# Patient Record
Sex: Female | Born: 1955
Health system: Southern US, Community
[De-identification: ages and names within clinical notes are randomized; demographics above are authoritative.]

## PROBLEM LIST (undated history)

## (undated) DIAGNOSIS — D759 Disease of blood and blood-forming organs, unspecified: Secondary | ICD-10-CM

## (undated) DIAGNOSIS — F419 Anxiety disorder, unspecified: Secondary | ICD-10-CM

## (undated) DIAGNOSIS — IMO0002 Reserved for concepts with insufficient information to code with codable children: Secondary | ICD-10-CM

## (undated) DIAGNOSIS — I10 Essential (primary) hypertension: Secondary | ICD-10-CM

## (undated) DIAGNOSIS — K759 Inflammatory liver disease, unspecified: Secondary | ICD-10-CM

## (undated) DIAGNOSIS — Z8489 Family history of other specified conditions: Secondary | ICD-10-CM

---

## 1998-11-24 ENCOUNTER — Encounter (INDEPENDENT_AMBULATORY_CARE_PROVIDER_SITE_OTHER): Payer: Self-pay

## 1998-11-24 ENCOUNTER — Other Ambulatory Visit: Admission: RE | Admit: 1998-11-24 | Discharge: 1998-11-24 | Payer: Self-pay | Admitting: Gynecology

## 2005-03-05 ENCOUNTER — Ambulatory Visit: Payer: Self-pay | Admitting: Gastroenterology

## 2005-04-18 ENCOUNTER — Ambulatory Visit: Payer: Self-pay | Admitting: Gastroenterology

## 2005-05-20 ENCOUNTER — Ambulatory Visit (HOSPITAL_COMMUNITY): Admission: RE | Admit: 2005-05-20 | Discharge: 2005-05-20 | Payer: Self-pay | Admitting: Gastroenterology

## 2005-05-20 ENCOUNTER — Encounter (INDEPENDENT_AMBULATORY_CARE_PROVIDER_SITE_OTHER): Payer: Self-pay | Admitting: Specialist

## 2005-11-29 ENCOUNTER — Ambulatory Visit: Payer: Self-pay | Admitting: Gastroenterology

## 2006-07-08 ENCOUNTER — Ambulatory Visit: Payer: Self-pay | Admitting: Gastroenterology

## 2008-08-18 ENCOUNTER — Ambulatory Visit: Payer: Self-pay | Admitting: Gastroenterology

## 2012-10-23 ENCOUNTER — Encounter: Payer: Self-pay | Admitting: Internal Medicine

## 2016-05-06 ENCOUNTER — Inpatient Hospital Stay (HOSPITAL_COMMUNITY): Payer: Federal, State, Local not specified - PPO | Admitting: Certified Registered Nurse Anesthetist

## 2016-05-06 ENCOUNTER — Inpatient Hospital Stay (HOSPITAL_COMMUNITY)
Admission: EM | Admit: 2016-05-06 | Discharge: 2016-05-12 | DRG: 331 | Disposition: A | Discharge status: Home / Self Care | Payer: Federal, State, Local not specified - PPO | Attending: Emergency Medicine | Admitting: Emergency Medicine

## 2016-05-06 ENCOUNTER — Encounter (HOSPITAL_COMMUNITY): Admission: EM | Disposition: A | Discharge status: Home / Self Care | Payer: Self-pay | Source: Home / Self Care

## 2016-05-06 ENCOUNTER — Encounter (HOSPITAL_COMMUNITY): Payer: Self-pay | Admitting: Neurology

## 2016-05-06 ENCOUNTER — Emergency Department (HOSPITAL_COMMUNITY): Payer: Federal, State, Local not specified - PPO

## 2016-05-06 DIAGNOSIS — R1013 Epigastric pain: Secondary | ICD-10-CM

## 2016-05-06 DIAGNOSIS — R198 Other specified symptoms and signs involving the digestive system and abdomen: Secondary | ICD-10-CM | POA: Diagnosis present

## 2016-05-06 DIAGNOSIS — F17200 Nicotine dependence, unspecified, uncomplicated: Secondary | ICD-10-CM | POA: Diagnosis present

## 2016-05-06 DIAGNOSIS — K668 Other specified disorders of peritoneum: Secondary | ICD-10-CM | POA: Diagnosis present

## 2016-05-06 DIAGNOSIS — Z5331 Laparoscopic surgical procedure converted to open procedure: Secondary | ICD-10-CM

## 2016-05-06 DIAGNOSIS — R160 Hepatomegaly, not elsewhere classified: Secondary | ICD-10-CM

## 2016-05-06 DIAGNOSIS — IMO0002 Reserved for concepts with insufficient information to code with codable children: Secondary | ICD-10-CM

## 2016-05-06 DIAGNOSIS — K265 Chronic or unspecified duodenal ulcer with perforation: Principal | ICD-10-CM | POA: Diagnosis present

## 2016-05-06 HISTORY — PX: LAPAROTOMY: SHX154

## 2016-05-06 HISTORY — DX: Inflammatory liver disease, unspecified: K75.9

## 2016-05-06 HISTORY — DX: Essential (primary) hypertension: I10

## 2016-05-06 HISTORY — PX: REPAIR OF PERFORATED ULCER: SHX6065

## 2016-05-06 HISTORY — DX: Anxiety disorder, unspecified: F41.9

## 2016-05-06 HISTORY — DX: Family history of other specified conditions: Z84.89

## 2016-05-06 HISTORY — DX: Reserved for concepts with insufficient information to code with codable children: IMO0002

## 2016-05-06 HISTORY — PX: LAPAROSCOPY: SHX197

## 2016-05-06 LAB — URINALYSIS, ROUTINE W REFLEX MICROSCOPIC
BILIRUBIN URINE: NEGATIVE
Glucose, UA: 150 mg/dL — AB
HGB URINE DIPSTICK: NEGATIVE
KETONES UR: NEGATIVE mg/dL
Leukocytes, UA: NEGATIVE
NITRITE: NEGATIVE
Protein, ur: NEGATIVE mg/dL
Specific Gravity, Urine: >1.046 — ABNORMAL HIGH (ref 1.005–1.030)
pH: 6 (ref 5.0–8.0)

## 2016-05-06 LAB — CBC
HCT: 40.6 % (ref 36.0–46.0)
Hemoglobin: 13.5 g/dL (ref 12.0–15.0)
MCH: 29.5 pg (ref 26.0–34.0)
MCHC: 33.3 g/dL (ref 30.0–36.0)
MCV: 88.8 fL (ref 78.0–100.0)
Platelets: 164 10*3/uL (ref 150–400)
RBC: 4.57 MIL/uL (ref 3.87–5.11)
RDW: 12.4 % (ref 11.5–15.5)
WBC: 11.7 10*3/uL — ABNORMAL HIGH (ref 4.0–10.5)

## 2016-05-06 LAB — COMPREHENSIVE METABOLIC PANEL
ALBUMIN: 4.2 g/dL (ref 3.5–5.0)
ALK PHOS: 96 U/L (ref 38–126)
ALT: 17 U/L (ref 14–54)
AST: 18 U/L (ref 15–41)
Anion gap: 9 (ref 5–15)
BILIRUBIN TOTAL: 0.5 mg/dL (ref 0.3–1.2)
BUN: 11 mg/dL (ref 6–20)
CALCIUM: 9.5 mg/dL (ref 8.9–10.3)
CO2: 26 mmol/L (ref 22–32)
CREATININE: 0.85 mg/dL (ref 0.44–1.00)
Chloride: 106 mmol/L (ref 101–111)
GFR calc Af Amer: >60 mL/min (ref >60)
GFR calc non Af Amer: >60 mL/min (ref >60)
GLUCOSE: 205 mg/dL — AB (ref 65–99)
Potassium: 3.2 mmol/L — ABNORMAL LOW (ref 3.5–5.1)
Sodium: 141 mmol/L (ref 135–145)
TOTAL PROTEIN: 7.3 g/dL (ref 6.5–8.1)

## 2016-05-06 LAB — TYPE AND SCREEN
ABO/RH(D): O POS
Antibody Screen: NEGATIVE

## 2016-05-06 LAB — LIPASE, BLOOD: Lipase: 25 U/L (ref 11–51)

## 2016-05-06 LAB — APTT: APTT: 33 s (ref 24–36)

## 2016-05-06 LAB — PROTIME-INR
INR: 1.01
PROTHROMBIN TIME: 13.3 s (ref 11.4–15.2)

## 2016-05-06 LAB — ABO/RH: ABO/RH(D): O POS

## 2016-05-06 SURGERY — LAPAROSCOPY, DIAGNOSTIC
Anesthesia: General | Site: Abdomen

## 2016-05-06 MED ORDER — SUCCINYLCHOLINE CHLORIDE 200 MG/10ML IV SOSY
PREFILLED_SYRINGE | INTRAVENOUS | Status: AC
  Filled 2016-05-06: qty 30

## 2016-05-06 MED ORDER — IOPAMIDOL (ISOVUE-300) INJECTION 61%
INTRAVENOUS | Status: AC
  Administered 2016-05-06: 100 mL
  Filled 2016-05-06: qty 100

## 2016-05-06 MED ORDER — PANTOPRAZOLE SODIUM 40 MG IV SOLR
40.0000 mg | Freq: Two times a day (BID) | INTRAVENOUS | Status: DC
  Administered 2016-05-06 – 2016-05-11 (×10): 40 mg via INTRAVENOUS
  Filled 2016-05-06 (×10): qty 40

## 2016-05-06 MED ORDER — MORPHINE SULFATE (PF) 2 MG/ML IV SOLN
1.0000 mg | INTRAVENOUS | Status: DC | PRN
  Administered 2016-05-06: 2 mg via INTRAVENOUS
  Filled 2016-05-06: qty 1

## 2016-05-06 MED ORDER — EVICEL 5 ML EX KIT
PACK | CUTANEOUS | Status: DC | PRN
  Administered 2016-05-06: 5 mL

## 2016-05-06 MED ORDER — PROMETHAZINE HCL 25 MG/ML IJ SOLN
12.5000 mg | Freq: Four times a day (QID) | INTRAMUSCULAR | Status: DC | PRN
  Administered 2016-05-06: 12.5 mg via INTRAVENOUS
  Filled 2016-05-06: qty 1

## 2016-05-06 MED ORDER — MORPHINE SULFATE (PF) 4 MG/ML IV SOLN
4.0000 mg | Freq: Once | INTRAVENOUS | Status: AC
  Administered 2016-05-06: 4 mg via INTRAVENOUS
  Filled 2016-05-06: qty 1

## 2016-05-06 MED ORDER — WHITE PETROLATUM GEL: Status: DC | PRN

## 2016-05-06 MED ORDER — WHITE PETROLATUM GEL
Status: AC
  Administered 2016-05-06: 16:00:00
  Filled 2016-05-06: qty 1

## 2016-05-06 MED ORDER — SUGAMMADEX SODIUM 200 MG/2ML IV SOLN
INTRAVENOUS | Status: DC | PRN
  Administered 2016-05-06: 200 mg via INTRAVENOUS

## 2016-05-06 MED ORDER — BUPIVACAINE HCL (PF) 0.25 % IJ SOLN
INTRAMUSCULAR | Status: AC
  Filled 2016-05-06: qty 30

## 2016-05-06 MED ORDER — SODIUM CHLORIDE 0.9 % IV SOLN: INTRAVENOUS | Status: DC

## 2016-05-06 MED ORDER — ROCURONIUM BROMIDE 10 MG/ML (PF) SYRINGE
PREFILLED_SYRINGE | INTRAVENOUS | Status: DC | PRN
  Administered 2016-05-06: 50 mg via INTRAVENOUS
  Administered 2016-05-06: 20 mg via INTRAVENOUS

## 2016-05-06 MED ORDER — STUDY - INVESTIGATIONAL DRUG SIMPLE RECORD
Status: AC
Start: 2016-05-06 — End: 2016-05-07
  Filled 2016-05-06: qty 0

## 2016-05-06 MED ORDER — PHENYLEPHRINE HCL 10 MG/ML IJ SOLN
INTRAMUSCULAR | Status: DC | PRN
  Administered 2016-05-06 (×2): 120 ug via INTRAVENOUS
  Administered 2016-05-06: 160 ug via INTRAVENOUS

## 2016-05-06 MED ORDER — HYDROMORPHONE HCL 2 MG/ML IJ SOLN
1.0000 mg | Freq: Once | INTRAMUSCULAR | Status: AC
  Administered 2016-05-06: 1 mg via INTRAVENOUS
  Filled 2016-05-06: qty 1

## 2016-05-06 MED ORDER — POTASSIUM CHLORIDE IN NACL 40-0.9 MEQ/L-% IV SOLN
INTRAVENOUS | Status: DC
  Administered 2016-05-06 – 2016-05-09 (×9): 125 mL/h via INTRAVENOUS
  Administered 2016-05-10 – 2016-05-11 (×3): 75 mL/h via INTRAVENOUS
  Filled 2016-05-06 (×14): qty 1000

## 2016-05-06 MED ORDER — ONDANSETRON HCL 4 MG/2ML IJ SOLN
INTRAMUSCULAR | Status: AC
  Filled 2016-05-06: qty 2

## 2016-05-06 MED ORDER — EVICEL 5 ML EX KIT
PACK | CUTANEOUS | Status: AC
  Filled 2016-05-06: qty 1

## 2016-05-06 MED ORDER — NALOXONE HCL 0.4 MG/ML IJ SOLN: 0.4000 mg | INTRAMUSCULAR | Status: DC | PRN

## 2016-05-06 MED ORDER — FENTANYL CITRATE (PF) 100 MCG/2ML IJ SOLN: 25.0000 ug | INTRAMUSCULAR | Status: DC | PRN

## 2016-05-06 MED ORDER — PIPERACILLIN-TAZOBACTAM 3.375 G IVPB 30 MIN
3.3750 g | Freq: Once | INTRAVENOUS | Status: AC
  Administered 2016-05-06: 3.375 g via INTRAVENOUS
  Filled 2016-05-06: qty 50

## 2016-05-06 MED ORDER — HYDROMORPHONE 1 MG/ML IV SOLN
INTRAVENOUS | Status: DC
  Administered 2016-05-06: 25 mg via INTRAVENOUS
  Administered 2016-05-07: 0.3 mg via INTRAVENOUS
  Administered 2016-05-07: 0.6 mg via INTRAVENOUS
  Administered 2016-05-08 (×3): 0.3 mg via INTRAVENOUS
  Administered 2016-05-08 – 2016-05-09 (×2): 0 mg via INTRAVENOUS
  Administered 2016-05-09: 1 mg via INTRAVENOUS
  Administered 2016-05-09: 1 mL via INTRAVENOUS
  Administered 2016-05-09: 0.6 mg via INTRAVENOUS
  Administered 2016-05-09: 1 mg via INTRAVENOUS
  Administered 2016-05-09: 0.3 mg via INTRAVENOUS
  Administered 2016-05-10: 1 mL via INTRAVENOUS
  Administered 2016-05-10: 0 mL via INTRAVENOUS
  Filled 2016-05-06: qty 25

## 2016-05-06 MED ORDER — PHENYLEPHRINE 40 MCG/ML (10ML) SYRINGE FOR IV PUSH (FOR BLOOD PRESSURE SUPPORT)
PREFILLED_SYRINGE | INTRAVENOUS | Status: AC
  Filled 2016-05-06: qty 10

## 2016-05-06 MED ORDER — FENTANYL CITRATE (PF) 100 MCG/2ML IJ SOLN
INTRAMUSCULAR | Status: DC | PRN
  Administered 2016-05-06 (×4): 50 ug via INTRAVENOUS
  Administered 2016-05-06 (×2): 100 ug via INTRAVENOUS

## 2016-05-06 MED ORDER — LACTATED RINGERS IV SOLN
INTRAVENOUS | Status: DC
  Administered 2016-05-06 (×2): via INTRAVENOUS

## 2016-05-06 MED ORDER — PHENYLEPHRINE HCL 10 MG/ML IJ SOLN
INTRAMUSCULAR | Status: DC | PRN
  Administered 2016-05-06: 50 ug/min via INTRAVENOUS

## 2016-05-06 MED ORDER — SUCCINYLCHOLINE CHLORIDE 20 MG/ML IJ SOLN
INTRAMUSCULAR | Status: DC | PRN
  Administered 2016-05-06: 120 mg via INTRAVENOUS

## 2016-05-06 MED ORDER — BUPIVACAINE HCL 0.25 % IJ SOLN
INTRAMUSCULAR | Status: DC | PRN
  Administered 2016-05-06: 3 mL

## 2016-05-06 MED ORDER — PIPERACILLIN-TAZOBACTAM 3.375 G IVPB
3.3750 g | Freq: Three times a day (TID) | INTRAVENOUS | Status: DC
  Administered 2016-05-06 – 2016-05-11 (×14): 3.375 g via INTRAVENOUS
  Filled 2016-05-06 (×15): qty 50

## 2016-05-06 MED ORDER — ONDANSETRON 4 MG PO TBDP: 4.0000 mg | ORAL_TABLET | Freq: Four times a day (QID) | ORAL | Status: DC | PRN

## 2016-05-06 MED ORDER — SODIUM CHLORIDE 0.9 % IV BOLUS (SEPSIS)
1000.0000 mL | Freq: Once | INTRAVENOUS | Status: AC
  Administered 2016-05-06: 1000 mL via INTRAVENOUS

## 2016-05-06 MED ORDER — ONDANSETRON HCL 4 MG/2ML IJ SOLN
4.0000 mg | Freq: Once | INTRAMUSCULAR | Status: AC
  Administered 2016-05-06: 4 mg via INTRAVENOUS
  Filled 2016-05-06: qty 2

## 2016-05-06 MED ORDER — ONDANSETRON HCL 4 MG/2ML IJ SOLN
INTRAMUSCULAR | Status: AC
Start: 2016-05-06 — End: 2016-05-07
  Filled 2016-05-06: qty 2

## 2016-05-06 MED ORDER — ENOXAPARIN SODIUM 40 MG/0.4ML ~~LOC~~ SOLN
40.0000 mg | SUBCUTANEOUS | Status: DC
  Administered 2016-05-07 – 2016-05-12 (×6): 40 mg via SUBCUTANEOUS
  Filled 2016-05-06 (×6): qty 0.4

## 2016-05-06 MED ORDER — PROPOFOL 10 MG/ML IV BOLUS
INTRAVENOUS | Status: AC
  Filled 2016-05-06: qty 20

## 2016-05-06 MED ORDER — SODIUM CHLORIDE 0.9% FLUSH: 9.0000 mL | INTRAVENOUS | Status: DC | PRN

## 2016-05-06 MED ORDER — MORPHINE SULFATE (PF) 4 MG/ML IV SOLN: 1.0000 mg | INTRAVENOUS | Status: DC | PRN

## 2016-05-06 MED ORDER — PROPOFOL 10 MG/ML IV BOLUS
INTRAVENOUS | Status: DC | PRN
  Administered 2016-05-06: 120 mg via INTRAVENOUS
  Administered 2016-05-06: 60 mg via INTRAVENOUS

## 2016-05-06 MED ORDER — DEXTROSE-NACL 5-0.45 % IV SOLN
INTRAVENOUS | Status: DC
  Administered 2016-05-06: 10:00:00 via INTRAVENOUS

## 2016-05-06 MED ORDER — DIPHENHYDRAMINE HCL 50 MG/ML IJ SOLN: 25.0000 mg | Freq: Four times a day (QID) | INTRAMUSCULAR | Status: DC | PRN

## 2016-05-06 MED ORDER — ESMOLOL HCL 100 MG/10ML IV SOLN
INTRAVENOUS | Status: AC
  Filled 2016-05-06: qty 10

## 2016-05-06 MED ORDER — DIPHENHYDRAMINE HCL 25 MG PO CAPS: 25.0000 mg | ORAL_CAPSULE | Freq: Four times a day (QID) | ORAL | Status: DC | PRN

## 2016-05-06 MED ORDER — MIDAZOLAM HCL 2 MG/2ML IJ SOLN
INTRAMUSCULAR | Status: AC
  Filled 2016-05-06: qty 2

## 2016-05-06 MED ORDER — FENTANYL CITRATE (PF) 100 MCG/2ML IJ SOLN
INTRAMUSCULAR | Status: AC
  Filled 2016-05-06: qty 4

## 2016-05-06 MED ORDER — MIDAZOLAM HCL 5 MG/5ML IJ SOLN
INTRAMUSCULAR | Status: DC | PRN
  Administered 2016-05-06: 2 mg via INTRAVENOUS

## 2016-05-06 MED ORDER — ONDANSETRON HCL 4 MG/2ML IJ SOLN
4.0000 mg | Freq: Four times a day (QID) | INTRAMUSCULAR | Status: DC | PRN
  Administered 2016-05-06: 4 mg via INTRAVENOUS
  Filled 2016-05-06: qty 2

## 2016-05-06 MED ORDER — LIDOCAINE 2% (20 MG/ML) 5 ML SYRINGE
INTRAMUSCULAR | Status: DC | PRN
  Administered 2016-05-06: 100 mg via INTRAVENOUS

## 2016-05-06 MED ORDER — ONDANSETRON HCL 4 MG/2ML IJ SOLN
INTRAMUSCULAR | Status: DC | PRN
  Administered 2016-05-06: 4 mg via INTRAVENOUS

## 2016-05-06 MED ORDER — 0.9 % SODIUM CHLORIDE (POUR BTL) OPTIME
TOPICAL | Status: DC | PRN
  Administered 2016-05-06 (×4): 1000 mL

## 2016-05-06 MED ORDER — ROCURONIUM BROMIDE 50 MG/5ML IV SOSY
PREFILLED_SYRINGE | INTRAVENOUS | Status: AC
  Filled 2016-05-06: qty 15

## 2016-05-06 MED ORDER — ALBUMIN HUMAN 5 % IV SOLN
INTRAVENOUS | Status: DC | PRN
  Administered 2016-05-06: 14:00:00 via INTRAVENOUS

## 2016-05-06 MED ORDER — SUGAMMADEX SODIUM 200 MG/2ML IV SOLN
INTRAVENOUS | Status: AC
  Filled 2016-05-06: qty 2

## 2016-05-06 SURGICAL SUPPLY — 50 items
ADH SKN CLS APL DERMABOND .7 (GAUZE/BANDAGES/DRESSINGS)
BLADE SURG 10 STRL SS (BLADE) ×2 IMPLANT
BLADE SURG ROTATE 9660 (MISCELLANEOUS) ×2 IMPLANT
CANISTER SUCTION 2500CC (MISCELLANEOUS) ×3 IMPLANT
CHLORAPREP W/TINT 26ML (MISCELLANEOUS) ×3 IMPLANT
COVER SURGICAL LIGHT HANDLE (MISCELLANEOUS) ×3 IMPLANT
DECANTER SPIKE VIAL GLASS SM (MISCELLANEOUS) ×4 IMPLANT
DERMABOND ADVANCED (GAUZE/BANDAGES/DRESSINGS)
DERMABOND ADVANCED .7 DNX12 (GAUZE/BANDAGES/DRESSINGS) ×1 IMPLANT
DRAIN CHANNEL 19F RND (DRAIN) ×2 IMPLANT
DRAPE LAPAROSCOPIC ABDOMINAL (DRAPES) ×1 IMPLANT
DRAPE WARM FLUID 44X44 (DRAPE) ×3 IMPLANT
ELECT CAUTERY BLADE 6.4 (BLADE) ×2 IMPLANT
ELECT REM PT RETURN 9FT ADLT (ELECTROSURGICAL) ×3
ELECTRODE REM PT RTRN 9FT ADLT (ELECTROSURGICAL) ×1 IMPLANT
EVACUATOR SILICONE 100CC (DRAIN) ×2 IMPLANT
GAUZE SPONGE 4X4 12PLY STRL (GAUZE/BANDAGES/DRESSINGS) ×2 IMPLANT
GLOVE BIO SURGEON STRL SZ7.5 (GLOVE) ×1 IMPLANT
GOWN STRL REUS W/ TWL LRG LVL3 (GOWN DISPOSABLE) ×3 IMPLANT
GOWN STRL REUS W/TWL 2XL LVL3 (GOWN DISPOSABLE) ×3 IMPLANT
GOWN STRL REUS W/TWL LRG LVL3 (GOWN DISPOSABLE) ×9
KIT BASIN OR (CUSTOM PROCEDURE TRAY) ×3 IMPLANT
KIT ROOM TURNOVER OR (KITS) ×3 IMPLANT
NS IRRIG 1000ML POUR BTL (IV SOLUTION) ×9 IMPLANT
PAD ABD 8X10 STRL (GAUZE/BANDAGES/DRESSINGS) ×2 IMPLANT
PAD ARMBOARD 7.5X6 YLW CONV (MISCELLANEOUS) ×6 IMPLANT
PENCIL BUTTON HOLSTER BLD 10FT (ELECTRODE) ×2 IMPLANT
SCALPEL HARMONIC ACE (MISCELLANEOUS) IMPLANT
SCISSORS LAP 5X35 DISP (ENDOMECHANICALS) ×2 IMPLANT
SET IRRIG TUBING LAPAROSCOPIC (IRRIGATION / IRRIGATOR) IMPLANT
SLEEVE ENDOPATH XCEL 5M (ENDOMECHANICALS) ×3 IMPLANT
SUCTION POOLE TIP (SUCTIONS) ×2 IMPLANT
SUT ETHILON 2 0 FS 18 (SUTURE) ×2 IMPLANT
SUT MNCRL AB 4-0 PS2 18 (SUTURE) ×1 IMPLANT
SUT PDS AB 1 TP1 96 (SUTURE) ×4 IMPLANT
SUT SILK 2 0 SH CR/8 (SUTURE) ×2 IMPLANT
SUT SILK 2 0 TIES 10X30 (SUTURE) ×2 IMPLANT
SUT SILK 3 0 SH CR/8 (SUTURE) ×2 IMPLANT
SUT SILK 3 0 TIES 10X30 (SUTURE) ×2 IMPLANT
TOWEL OR 17X24 6PK STRL BLUE (TOWEL DISPOSABLE) ×3 IMPLANT
TOWEL OR 17X26 10 PK STRL BLUE (TOWEL DISPOSABLE) ×1 IMPLANT
TRAY LAPAROSCOPIC MC (CUSTOM PROCEDURE TRAY) ×3 IMPLANT
TROCAR XCEL BLUNT TIP 100MML (ENDOMECHANICALS) ×2 IMPLANT
TROCAR XCEL NON-BLD 11X100MML (ENDOMECHANICALS) IMPLANT
TROCAR XCEL NON-BLD 5MMX100MML (ENDOMECHANICALS) ×3 IMPLANT
TUBE CONNECTING 12'X1/4 (SUCTIONS) ×1
TUBE CONNECTING 12X1/4 (SUCTIONS) ×1 IMPLANT
TUBING INSUFFLATION (TUBING) ×3 IMPLANT
WATER STERILE IRR 1000ML POUR (IV SOLUTION) IMPLANT
YANKAUER SUCT BULB TIP NO VENT (SUCTIONS) ×2 IMPLANT

## 2016-05-06 NOTE — Anesthesia Preprocedure Evaluation (Signed)
Anesthesia Evaluation  Patient identified by MRN, date of birth, ID band Patient awake    Reviewed: Allergy & Precautions, H&P , Patient's Chart, lab work & pertinent test results, reviewed documented beta blocker date and time   Airway Mallampati: II  TM Distance: >3 FB Neck ROM: full    Dental no notable dental hx.    Pulmonary Current Smoker,    Pulmonary exam normal breath sounds clear to auscultation       Cardiovascular hypertension,  Rhythm:regular Rate:Normal     Neuro/Psych    GI/Hepatic   Endo/Other    Renal/GU      Musculoskeletal   Abdominal   Peds  Hematology   Anesthesia Other Findings   Reproductive/Obstetrics                             Anesthesia Physical Anesthesia Plan  ASA: II  Anesthesia Plan: General   Post-op Pain Management:    Induction: Intravenous  Airway Management Planned: Oral ETT  Additional Equipment:   Intra-op Plan:   Post-operative Plan: Extubation in OR  Informed Consent: I have reviewed the patients History and Physical, chart, labs and discussed the procedure including the risks, benefits and alternatives for the proposed anesthesia with the patient or authorized representative who has indicated his/her understanding and acceptance.   Dental Advisory Given and Dental advisory given  Plan Discussed with: CRNA and Surgeon  Anesthesia Plan Comments: (  Discussed general anesthesia, including possible nausea, instrumentation of airway, sore throat,pulmonary aspiration, etc. I asked if the were any outstanding questions, or  concerns before we proceeded.)        Anesthesia Quick Evaluation

## 2016-05-06 NOTE — Brief Op Note (Addendum)
05/06/2016  2:04 PM  PATIENT:  Kristen Everett  61 y.o. female  PRE-OPERATIVE DIAGNOSIS:  PERFORATED ULCER  POST-OPERATIVE DIAGNOSIS:  PERFORATED duodenal ULCER  PROCEDURE:  Procedure(s): DIAGNOSTIC LAPAROSCOPY (N/A) EXPLORATORY LAPAROTOMY (N/A) REPAIR OF PERFORATED DUODENAL ULCER (N/A) with graham patch  SURGEON:  Surgeon(s) and Role:    * Greer Pickerel, MD - Primary    * Arta Bruce Kinsinger, MD - Assisting  PHYSICIAN ASSISTANT:   ASSISTANTS: see above   ANESTHESIA:   general  EBL:  Total I/O In: 2250 [I.V.:1000; IV Piggyback:1250] Out: 140 [Urine:90; Blood:50]  BLOOD ADMINISTERED:none  DRAINS: (18) Jackson-Pratt drain(s) with closed bulb suction in the RUQ/perf area, Nasogastric Tube and Urinary Catheter (Foley)   LOCAL MEDICATIONS USED:  MARCAINE     SPECIMEN:  No Specimen  DISPOSITION OF SPECIMEN:  N/A  COUNTS:  YES  TOURNIQUET:  * No tourniquets in log *  DICTATION: .Other Dictation: Dictation Number [id number]  [phone]  PLAN OF CARE: Admit to inpatient   PATIENT DISPOSITION:  PACU - hemodynamically stable.   Delay start of Pharmacological VTE agent (>24hrs) due to surgical blood loss or risk of bleeding: no  Leighton Ruff. Redmond Pulling, MD, FACS General, Bariatric, & Minimally Invasive Surgery [facility], [place]

## 2016-05-06 NOTE — Anesthesia Procedure Notes (Addendum)
Procedure Name: Intubation Date/Time: 05/06/2016 12:52 PM Performed by: Salli Quarry Layne Dilauro Pre-anesthesia Checklist: Patient identified, Emergency Drugs available, Suction available and Patient being monitored Patient Re-evaluated:Patient Re-evaluated prior to inductionOxygen Delivery Method: Circle System Utilized Preoxygenation: Pre-oxygenation with 100% oxygen Intubation Type: IV induction, Rapid sequence and Cricoid Pressure applied Ventilation: Mask ventilation without difficulty Laryngoscope Size: Mac and 3 Grade View: Grade IV Tube type: Oral Tube size: 7.0 mm Number of attempts: 1 Airway Equipment and Method: Stylet,  Oral airway and Bougie stylet Placement Confirmation: ETT inserted through vocal cords under direct vision,  positive ETCO2 and breath sounds checked- equal and bilateral Secured at: 22 cm Tube secured with: Tape Dental Injury: Teeth and Oropharynx as per pre-operative assessment  Difficulty Due To: Difficulty was unanticipated, Difficult Airway- due to anterior larynx and Difficult Airway- due to reduced neck mobility Future Recommendations: Recommend- induction with short-acting agent, and alternative techniques readily available Comments: DL x1 with MAC 3 by Edison Nasuti, paramedic student, grade 4 view; DL x2 with MAC 3 by CRNA, grade 4 view, immediate recognition of esophageal intubation. ETT removed, mask ventilation with cricoid pressure. DL x3 by CRNA with MAC 3, grade 4 view, bougie stylette used, intubation with 7.0 oral ETT, BBS=, +ETCO2. Tube secured.

## 2016-05-06 NOTE — ED Provider Notes (Signed)
Medical screening examination/treatment/procedure(s) were conducted as a shared visit with non-physician practitioner(s) and myself.  I personally evaluated the patient during the encounter.  61 year old female history of gastritis,hypertension, hyperlipidemia who presents immersed department with acute onset of epigastric pain earlier in the morning. Patient states that it came on relatively quickly. She has vomited with it as well. No history of the same. Has peritonitis on exam. No distention this time. Has rebound, involuntary guarding and she is splinting secondary to abdominal pain. She is a little hypoxic so I started her on oxygen I think this is likely secondary to the splinting because of abdominal pain.she still has pretty severe abdominal pain has not improved since she's been here. CT scan with evidence of free air likely from a perforated duodenal ulcer. Also has a liver mass. Surgery will be called for the perforation. Patient needs a follow-up imaging study either MRI or CT scan for the mass which I made them aware of and they can do after they get stabilized from the perforation.   Merrily Pew, MD 05/06/16 [phone]

## 2016-05-06 NOTE — Progress Notes (Signed)
H&p pending by my PA.  Interviewed and examined with PA perf duodenal ulcer Right hepatic mass gerd htn  Ill appearing,  Firm abd, tender to palpation in upper abd, guarding but no overt peritonitis  rec dx laparoscopy, possible exp lap, repair of perf ulcer  I discussed the procedure in detail.  We discussed the risks and benefits of surgery including, but not limited to bleeding, infection (such as wound infection, abdominal abscess), injury to surrounding structures, blood clot formation, urinary retention, incisional hernia,  leak, anesthesia risks, pulmonary & cardiac complications such as pneumonia &/or heart attack, need for additional procedures, ileus, & prolonged hospitalization.  We discussed the typical postoperative recovery course, including limitations & restrictions postoperatively. I explained that the likelihood of improvement in their symptoms is good.  Will need outpt f/u for liver mass (prior liver bx in right lobe, hep c s/p harvoni- ?chronic changes, nontheless will still need w/u)  Pt's husband at [facility]. Redmond Pulling, MD, FACS General, Bariatric, & Minimally Invasive Surgery [facility], [place]

## 2016-05-06 NOTE — H&P (Signed)
Kristen Everett is an 61 y.o. female.   Chief Complaint: Dominant pain with perforated ulcer on CT scan HPI: Patient presented to the ED at 7 AM this morning with cramping and active vomiting she feels like she cannot catch her breath she was restless.  The patient reported onset of pain about 2 hours prior to admission with one episode of vomiting. She was getting ready for work and time and was having severe cramping in her upper abdomen. She's never had anything like this before. She denied chest pain diarrhea or bloody stools back pain or urinary symptoms.  Workup in the ED shows she is afebrile, slightly tachycardic, blood pressure has been going up during her time in the ED.  Labs show potassium of 3.2 glucose of 205 creatinine 0.85 white count 11.7. Hemoglobin 13.5, hematocrit 40.6. Platelets are 164,000. CT scan of the abdomen the pelvis shows a heterogeneous enhancing mass in the central right hepatic lobe. The  4.5 cm heterogeneously enhancing lesion in the central right hepatic lobe. This is suspicious for hepatic neoplasm, with abscess considered less likely.   A moderate amount of free intraperitoneal air seen in the abdomen this appeared to be due to a perforated ulcer involving the duodenal bulb. Small large bowels were unremarkable.  We are asked to see.  Past Medical History:  Diagnosis Date  Hypertension   History of gastric ulcer 20 years ago    Tobacco use greater than 20 years.    Hyperlipidemia      History reviewed. No pertinent surgical history. No prior surgeries  No family history on file. Social History:  reports that she has been smoking.  She has never used smokeless tobacco. She reports that she does not drink alcohol. Her drug history is not on file. Tobacco: Less than 1 pack per day, 25 years or more. EtOH: None Drugs: None   Allergies: No Known Allergies  Prior to Admission medications   Not on File: Reports being on Prilosec Lipitor, a blood pressure  medicine something for anxiety.       Results for orders placed or performed during the hospital encounter of 05/06/16 (from the past 48 hour(s))  Lipase, blood     Status: None   Collection Time: 05/06/16  7:45 AM  Result Value Ref Range   Lipase 25 11 - 51 U/L  Comprehensive metabolic panel     Status: Abnormal   Collection Time: 05/06/16  7:45 AM  Result Value Ref Range   Sodium 141 135 - 145 mmol/L   Potassium 3.2 (L) 3.5 - 5.1 mmol/L   Chloride 106 101 - 111 mmol/L   CO2 26 22 - 32 mmol/L   Glucose, Bld 205 (H) 65 - 99 mg/dL   BUN 11 6 - 20 mg/dL   Creatinine, Ser 0.85 0.44 - 1.00 mg/dL   Calcium 9.5 8.9 - 10.3 mg/dL   Total Protein 7.3 6.5 - 8.1 g/dL   Albumin 4.2 3.5 - 5.0 g/dL   AST 18 15 - 41 U/L   ALT 17 14 - 54 U/L   Alkaline Phosphatase 96 38 - 126 U/L   Total Bilirubin 0.5 0.3 - 1.2 mg/dL   GFR calc non Af Amer >60 >60 mL/min   GFR calc Af Amer >60 >60 mL/min    Comment: (NOTE) The eGFR has been calculated using the CKD EPI equation. This calculation has not been validated in all clinical situations. eGFR's persistently <60 mL/min signify possible Chronic Kidney Disease.  Anion gap 9 5 - 15  CBC     Status: Abnormal   Collection Time: 05/06/16  7:45 AM  Result Value Ref Range   WBC 11.7 (H) 4.0 - 10.5 K/uL   RBC 4.57 3.87 - 5.11 MIL/uL   Hemoglobin 13.5 12.0 - 15.0 g/dL   HCT 40.6 36.0 - 46.0 %   MCV 88.8 78.0 - 100.0 fL   MCH 29.5 26.0 - 34.0 pg   MCHC 33.3 30.0 - 36.0 g/dL   RDW 12.4 11.5 - 15.5 %   Platelets 164 150 - 400 K/uL   Ct Abdomen Pelvis W Contrast  Result Date: 05/06/2016 CLINICAL DATA:  Severe abdominal pain and vomiting beginning this morning. EXAM: CT ABDOMEN AND PELVIS WITH CONTRAST TECHNIQUE: Multidetector CT imaging of the abdomen and pelvis was performed using the standard protocol following bolus administration of intravenous contrast. CONTRAST:  138m ISOVUE-300 IOPAMIDOL (ISOVUE-300) INJECTION 61% COMPARISON:  None. FINDINGS:  Lower Chest: Bilateral lower lobe atelectasis. Hepatobiliary: Heterogeneously enhancing mass is seen in the central right hepatic lobe involving segments 7 and 8. This measures 4.5 x 4.5 cm on image 18/2. No other liver masses identified. Gallbladder is unremarkable. No evidence of biliary ductal dilatation. Pancreas:  No mass or inflammatory changes. Spleen: Within normal limits in size and appearance. Adrenals/Urinary Tract: No masses identified. 3 mm calculus in midpole of right kidney. No evidence of hydronephrosis. Stomach/Bowel: A moderate amount of free intraperitoneal air is seen within the abdomen. This appears to be due to a perforated ulcer involving the duodenal bulb. Small and large bowel are unremarkable in appearance. Vascular/Lymphatic: No pathologically enlarged lymph nodes. No abdominal aortic aneurysm. Aortic atherosclerosis. Reproductive: A 3.5 cm subserosal fibroid is seen arising the right posterior corpus. Adnexal regions are unremarkable. Tiny amount of free fluid noted within pelvis. Other:  None. Musculoskeletal:  No suspicious bone lesions identified. IMPRESSION: Moderate amount of free intraperitoneal air, which appears to be due to perforated ulcer involving the duodenal bulb. 4.5 cm heterogeneously enhancing lesion in the central right hepatic lobe. This is suspicious for hepatic neoplasm, with abscess considered less likely. Consider percutaneous needle aspiration/biopsy, or nonemergent abdomen MRI without and with contrast when patient is able to cooperate with breath-holding. Bilateral lower lobe atelectasis. Incidental findings including nonobstructing right renal calculus and 3.5 cm posterior uterine fibroid. Critical Value/emergent results were called by telephone at the time of interpretation on 05/06/2016 at 10:03 am to Dr. MDayna Barkerin the ED, who verbally acknowledged these results. Electronically Signed   By: JEarle GellM.D.   On: 05/06/2016 10:10    Review of Systems   Constitutional: Positive for chills and fever. Negative for diaphoresis, malaise/fatigue and weight loss.  HENT: Negative.   Eyes: Negative.   Respiratory: Negative.   Cardiovascular: Negative.   Gastrointestinal: Positive for abdominal pain, constipation (She has problems with constipation followed by loose stools after the constipation breaks. ), diarrhea, heartburn, nausea and vomiting. Negative for blood in stool and melena.  Genitourinary: Positive for frequency.  Musculoskeletal: Negative.   Skin: Negative.   Neurological: Negative.  Negative for weakness.  Endo/Heme/Allergies: Negative.   Psychiatric/Behavioral: The patient is nervous/anxious.     Blood pressure 135/74, pulse 79, temperature 98.2 F (36.8 C), temperature source Oral, resp. rate (!) 27, height 5' 9"  (18.177m), weight 63.5 kg (140 lb), SpO2 94 %. Physical Exam  Constitutional: She is oriented to person, place, and time. She appears well-developed and well-nourished. She appears distressed.  hemodynamically stable but  she has good deal of pain. It hurts to breathe deep, it hurts to move, it hurts to bend.  HENT:  Head: Normocephalic and atraumatic.  Mouth/Throat: No oropharyngeal exudate.  Eyes: Right eye exhibits no discharge. Left eye exhibits no discharge. No scleral icterus.  Neck: Normal range of motion. Neck supple. No JVD present. No tracheal deviation present. No thyromegaly present.  Cardiovascular: Normal rate, regular rhythm, normal heart sounds and intact distal pulses.   No murmur heard. Respiratory: Effort normal and breath sounds normal. No respiratory distress. She has no wheezes. She has no rales. She exhibits no tenderness.  GI: She exhibits distension. She exhibits no mass. There is tenderness. There is rebound. There is no guarding.  Musculoskeletal: She exhibits no edema, tenderness or deformity.  Lymphadenopathy:    She has no cervical adenopathy.  Neurological: She is alert and oriented to  person, place, and time. No cranial nerve deficit.  Skin: Skin is warm and dry. No rash noted. She is not diaphoretic. No erythema. No pallor.  Psychiatric: She has a normal mood and affect. Her behavior is normal. Judgment and thought content normal.     Assessment/Plan Perforated viscus with free intraperitoneal air thought to be due to a perforated ulcer involving the duodenal bulb. 4.5 cm central right hepatic lobe suspicious for neoplasm Nonobstructing right renal calculus/3.5 cm posterior uterine fibroid History gastric ulcer Hypertension Tobacco use Hyperlipidemia Anxiety  Plan:  Dr. Redmond Pulling has reviewed the films and examined the patient. We are concerned with perforated viscus most likely a perforated duodenal ulcer. He's recommended surgery with diagnostic laparoscopy and possible exploratory laparotomy. The risks and benefits were discussed with the patient and her husband in detail. We are starting IV fluids, antibiotics, Protonix IV. She's been posted for the OR as next case.   Jourden Delmont, PA-C 05/06/2016, 11:10 AM

## 2016-05-06 NOTE — Anesthesia Postprocedure Evaluation (Signed)
Anesthesia Post Note  Patient: Kristen Everett  Procedure(s) Performed: Procedure(s) (LRB): DIAGNOSTIC LAPAROSCOPY (N/A) EXPLORATORY LAPAROTOMY (N/A) REPAIR OF PERFORATED DUODENAL ULCER (N/A)  Patient location during evaluation: PACU Anesthesia Type: General Level of consciousness: awake Pain management: pain level controlled Vital Signs Assessment: post-procedure vital signs reviewed and stable Respiratory status: spontaneous breathing Cardiovascular status: stable Postop Assessment: no signs of nausea or vomiting Anesthetic complications: no        Last Vitals:  Vitals:   05/06/16 1445 05/06/16 1448  BP:  (!) 163/81  Pulse: 98 99  Resp: 17 15  Temp:      Last Pain:  Vitals:   05/06/16 0838  TempSrc:   PainSc: 10-Worst pain ever   Pain Goal:                 Le Faulcon JR,JOHN Szymon Foiles

## 2016-05-06 NOTE — ED Triage Notes (Addendum)
Pt reports epigastric pain since this early this morning, is actively vomiting. Pain is cramping and feels like she can't catch her breath. Pt is restless in chair.

## 2016-05-06 NOTE — ED Provider Notes (Signed)
[facility] DEPT Provider Note   CSN: [id number] Arrival date & time: 05/06/16  0701     History   Chief Complaint Chief Complaint  Patient presents with  . Abdominal Pain    HPI Kristen Everett is a 61 y.o. female with history of hypertension and a lipidemia who presents with a two-hour history of severe upper abdominal pain and one episode of vomiting. Patient reports she was getting ready for work when she began having severe cramping to her upper abdomen. Patient began vomiting when she arrived in the emergency department. Patient describes her pain as constant and cramping without radiation. Patient had not eaten this morning. She has had some associated shortness of breath with pain, she states it takes her breath away. Patient has no history of abdominal surgeries. Patient states she has not experienced this before. Patient denies any chest pain, diarrhea, bloody stools, back pain, or urinary symptoms.   HPI  Past Medical History:  Diagnosis Date  . Hypertension     Patient Active Problem List   Diagnosis Date Noted  . Perforated viscus 05/06/2016    History reviewed. No pertinent surgical history.  OB History    No data available       Home Medications    Prior to Admission medications   Not on File    Family History No family history on file.  Social History Social History  Substance Use Topics  . Smoking status: Current Every Day Smoker  . Smokeless tobacco: Never Used  . Alcohol use No     Allergies   Patient has no known allergies.   Review of Systems Review of Systems  Constitutional: Negative for chills and fever.  HENT: Negative for facial swelling and sore throat.   Respiratory: Negative for shortness of breath.   Cardiovascular: Negative for chest pain.  Gastrointestinal: Positive for abdominal pain, nausea and vomiting. Negative for blood in stool.  Genitourinary: Negative for dysuria.  Musculoskeletal: Negative for back pain.    Skin: Negative for rash and wound.  Neurological: Negative for headaches.  Psychiatric/Behavioral: The patient is not nervous/anxious.      Physical Exam Updated Vital Signs BP 135/74   Pulse 79   Temp 98.2 F (36.8 C) (Oral)   Resp (!) 27   Ht 5\' 9"  (1.753 m)   Wt 63.5 kg   SpO2 94%   BMI 20.67 kg/m   Physical Exam  Constitutional: She appears well-developed and well-nourished. No distress.  HENT:  Head: Normocephalic and atraumatic.  Mouth/Throat: Oropharynx is clear and moist. No oropharyngeal exudate.  Eyes: Conjunctivae are normal. Pupils are equal, round, and reactive to light. Right eye exhibits no discharge. Left eye exhibits no discharge. No scleral icterus.  Neck: Normal range of motion. Neck supple. No thyromegaly present.  Cardiovascular: Normal rate, regular rhythm, normal heart sounds and intact distal pulses.  Exam reveals no gallop and no friction rub.   No murmur heard. Pulmonary/Chest: Effort normal and breath sounds normal. No stridor. No respiratory distress. She has no wheezes. She has no rales.  Abdominal: Soft. Bowel sounds are normal. She exhibits no distension. There is tenderness in the right upper quadrant, epigastric area and left upper quadrant. There is rebound and guarding. There is no CVA tenderness and no tenderness at McBurney's point.  Splinting  Musculoskeletal: She exhibits no edema.  Lymphadenopathy:    She has no cervical adenopathy.  Neurological: She is alert. Coordination normal.  Skin: Skin is warm and dry. No  rash noted. She is not diaphoretic. No pallor.  Psychiatric: She has a normal mood and affect.  Nursing note and vitals reviewed.    ED Treatments / Results  Labs (all labs ordered are listed, but only abnormal results are displayed) Labs Reviewed  COMPREHENSIVE METABOLIC PANEL - Abnormal; Notable for the following:       Result Value   Potassium 3.2 (*)    Glucose, Bld 205 (*)    All other components within normal  limits  CBC - Abnormal; Notable for the following:    WBC 11.7 (*)    All other components within normal limits  URINALYSIS, ROUTINE W REFLEX MICROSCOPIC - Abnormal; Notable for the following:    Color, Urine STRAW (*)    Specific Gravity, Urine >1.046 (*)    Glucose, UA 150 (*)    All other components within normal limits  LIPASE, BLOOD  PROTIME-INR  APTT  TYPE AND SCREEN  ABO/RH    EKG  EKG Interpretation None       Radiology Ct Abdomen Pelvis W Contrast  Result Date: 05/06/2016 CLINICAL DATA:  Severe abdominal pain and vomiting beginning this morning. EXAM: CT ABDOMEN AND PELVIS WITH CONTRAST TECHNIQUE: Multidetector CT imaging of the abdomen and pelvis was performed using the standard protocol following bolus administration of intravenous contrast. CONTRAST:  163mL ISOVUE-300 IOPAMIDOL (ISOVUE-300) INJECTION 61% COMPARISON:  None. FINDINGS: Lower Chest: Bilateral lower lobe atelectasis. Hepatobiliary: Heterogeneously enhancing mass is seen in the central right hepatic lobe involving segments 7 and 8. This measures 4.5 x 4.5 cm on image 18/2. No other liver masses identified. Gallbladder is unremarkable. No evidence of biliary ductal dilatation. Pancreas:  No mass or inflammatory changes. Spleen: Within normal limits in size and appearance. Adrenals/Urinary Tract: No masses identified. 3 mm calculus in midpole of right kidney. No evidence of hydronephrosis. Stomach/Bowel: A moderate amount of free intraperitoneal air is seen within the abdomen. This appears to be due to a perforated ulcer involving the duodenal bulb. Small and large bowel are unremarkable in appearance. Vascular/Lymphatic: No pathologically enlarged lymph nodes. No abdominal aortic aneurysm. Aortic atherosclerosis. Reproductive: A 3.5 cm subserosal fibroid is seen arising the right posterior corpus. Adnexal regions are unremarkable. Tiny amount of free fluid noted within pelvis. Other:  None. Musculoskeletal:  No  suspicious bone lesions identified. IMPRESSION: Moderate amount of free intraperitoneal air, which appears to be due to perforated ulcer involving the duodenal bulb. 4.5 cm heterogeneously enhancing lesion in the central right hepatic lobe. This is suspicious for hepatic neoplasm, with abscess considered less likely. Consider percutaneous needle aspiration/biopsy, or nonemergent abdomen MRI without and with contrast when patient is able to cooperate with breath-holding. Bilateral lower lobe atelectasis. Incidental findings including nonobstructing right renal calculus and 3.5 cm posterior uterine fibroid. Critical Value/emergent results were called by telephone at the time of interpretation on 05/06/2016 at 10:03 am to Dr. Dayna Barker in the ED, who verbally acknowledged these results. Electronically Signed   By: Earle Gell M.D.   On: 05/06/2016 10:10    Procedures Procedures (including critical care time)  Medications Ordered in ED Medications  piperacillin-tazobactam (ZOSYN) IVPB 3.375 g ( Intravenous Automatically Held 05/14/16 2200)  morphine 4 MG/ML injection 1-4 mg ( Intravenous MAR Hold 05/06/16 1205)  diphenhydrAMINE (BENADRYL) capsule 25 mg ( Oral MAR Hold 05/06/16 1205)    Or  diphenhydrAMINE (BENADRYL) injection 25 mg ( Intravenous MAR Hold 05/06/16 1205)  ondansetron (ZOFRAN-ODT) disintegrating tablet 4 mg ( Oral MAR Hold 05/06/16 1205)  Or  ondansetron (ZOFRAN) injection 4 mg ( Intravenous MAR Hold 05/06/16 1205)  0.9 % NaCl with KCl 40 mEq / L  infusion (not administered)  pantoprazole (PROTONIX) injection 40 mg (not administered)  lactated ringers infusion ( Intravenous New Bag/Given 05/06/16 1224)  bupivacaine (MARCAINE) 0.25 % (with pres) injection (1 mL  Given 05/06/16 1326)  0.9 % irrigation (POUR BTL) (1,000 mLs Irrigation Given 05/06/16 1331)  sodium chloride 0.9 % bolus 1,000 mL (0 mLs Intravenous Stopped 05/06/16 0902)  ondansetron (ZOFRAN) injection 4 mg (4 mg Intravenous Given  05/06/16 0815)  morphine 4 MG/ML injection 4 mg (4 mg Intravenous Given 05/06/16 0815)  HYDROmorphone (DILAUDID) injection 1 mg (1 mg Intravenous Given 05/06/16 0858)  iopamidol (ISOVUE-300) 61 % injection (100 mLs  Contrast Given 05/06/16 0937)  HYDROmorphone (DILAUDID) injection 1 mg (1 mg Intravenous Given 05/06/16 1023)  piperacillin-tazobactam (ZOSYN) IVPB 3.375 g (3.375 g Intravenous New Bag/Given 05/06/16 1032)     Initial Impression / Assessment and Plan / ED Course  I have reviewed the triage vital signs and the nursing notes.  Pertinent labs & imaging results that were available during my care of the patient were reviewed by me and considered in my medical decision making (see chart for details).     Patient's pain difficult to control in the ED. Patient given morphine 4mg , Dilaudid 2mg , and Zofran for nausea.   Patient was most likely perforated duodenal ulcer. CBC shows WBC 11.7. CMP shows potassium 3.2, glucose 205. Lipase 25. CT abdomen and pelvis shows [Moderate amount of free intraperitoneal air, which appears to be due to perforated ulcer involving the duodenal bulb. 4.5 cm heterogeneously enhancing lesion in the central right hepatic lobe. This is suspicious for hepatic neoplasm, with abscess considered less likely. Consider percutaneous needle aspiration/biopsy, or nonemergent abdomen MRI without and with contrast when patient is able to cooperate with breath-holding. Bilateral lower lobe atelectasis. Incidental findings including nonobstructing right renal calculus and 3.5 cm posterior uterine fibroid.] Patient was made aware of liver mass that needs further workup. I consulted general surgery who will take the patient to the OR. They have initiated antibiotics and more fluids. Patient vitals stable throughout ED course. Patient understands and agrees with plan. Patient also evaluated by Dr. Dayna Barker who guided the patient's management and agrees with plan.  Final Clinical  Impressions(s) / ED Diagnoses   Final diagnoses:  Epigastric pain  Liver mass    New Prescriptions There are no discharge medications for this patient.    Frederica Kuster, PA-C 05/06/16 [place], MD 05/06/16 [phone]

## 2016-05-06 NOTE — Transfer of Care (Signed)
Immediate Anesthesia Transfer of Care Note  Patient: Kristen Everett  Procedure(s) Performed: Procedure(s): DIAGNOSTIC LAPAROSCOPY (N/A) EXPLORATORY LAPAROTOMY (N/A) REPAIR OF PERFORATED DUODENAL ULCER (N/A)  Patient Location: PACU  Anesthesia Type:General  Level of Consciousness: awake, alert , oriented and patient cooperative  Airway & Oxygen Therapy: Patient Spontanous Breathing and Patient connected to nasal cannula oxygen  Post-op Assessment: Report given to RN and Post -op Vital signs reviewed and stable  Post vital signs: Reviewed and stable  Last Vitals:  Vitals:   05/06/16 1416 05/06/16 1419  BP:  (!) 169/86  Pulse: (!) 104 (!) 107  Resp: 14 19  Temp: 36.7 C     Last Pain:  Vitals:   05/06/16 0838  TempSrc:   PainSc: 10-Worst pain ever         Complications: No apparent anesthesia complications

## 2016-05-07 ENCOUNTER — Encounter (HOSPITAL_COMMUNITY): Payer: Self-pay | Admitting: General Surgery

## 2016-05-07 LAB — BASIC METABOLIC PANEL
Anion gap: 9 (ref 5–15)
BUN: 6 mg/dL (ref 6–20)
CALCIUM: 9.1 mg/dL (ref 8.9–10.3)
CHLORIDE: 104 mmol/L (ref 101–111)
CO2: 23 mmol/L (ref 22–32)
CREATININE: 0.78 mg/dL (ref 0.44–1.00)
Glucose, Bld: 109 mg/dL — ABNORMAL HIGH (ref 65–99)
Potassium: 3.8 mmol/L (ref 3.5–5.1)
SODIUM: 136 mmol/L (ref 135–145)

## 2016-05-07 LAB — CBC
HEMATOCRIT: 38.2 % (ref 36.0–46.0)
Hemoglobin: 12.9 g/dL (ref 12.0–15.0)
MCH: 29.7 pg (ref 26.0–34.0)
MCHC: 33.8 g/dL (ref 30.0–36.0)
MCV: 88 fL (ref 78.0–100.0)
PLATELETS: 133 10*3/uL — AB (ref 150–400)
RBC: 4.34 MIL/uL (ref 3.87–5.11)
RDW: 12.5 % (ref 11.5–15.5)
WBC: 11.6 10*3/uL — AB (ref 4.0–10.5)

## 2016-05-07 LAB — MAGNESIUM: MAGNESIUM: 1.6 mg/dL — AB (ref 1.7–2.4)

## 2016-05-07 NOTE — Progress Notes (Signed)
1 Day Post-Op  Subjective: She looks pretty good this a.m. pain is well controlled with the PCA. NG is working she is put out 400 last evening and again another 400 this a.m. The drainage from the [place]. Is serosanguineous.  Objective: Vital signs in last 24 hours: Temp:  [97 F (36.1 C)-100.2 F (37.9 C)] 100.2 F (37.9 C) (01/30 0627) Pulse Rate:  [83-107] 83 (01/30 0627) Resp:  [14-26] 23 (01/30 0627) BP: (151-169)/(67-86) 161/70 (01/30 0627) SpO2:  [95 %-97 %] 95 % (01/30 0627) Weight:  [68.9 kg (151 lb 14.4 oz)] 68.9 kg (151 lb 14.4 oz) (01/29 1605) Last BM Date: 05/05/16 3600 IV Urine 2765 Drain 50 NG 400 Tm 100.2, VSS Labs pending Intake/Output from previous day: 01/29 0701 - 01/30 0700 In: 3605 [I.V.:2225; NG/GT:30; IV Piggyback:1350] Out: [id number] [Urine:2765; Emesis/NG output:400; Drains:50; Blood:50] Intake/Output this shift: Total I/O In: 0  Out: 400 [Emesis/NG output:400]  General appearance: alert, cooperative and no distress Resp: clear to auscultation bilaterally GI: Soft, sore, midline incision is mostly closed. Still somewhat distended and tender no bowel sounds. JP drainage is serosanguineous.  Lab Results:   Recent Labs  05/06/16 0745  WBC 11.7*  HGB 13.5  HCT 40.6  PLT 164    BMET  Recent Labs  05/06/16 0745  NA 141  K 3.2*  CL 106  CO2 26  GLUCOSE 205*  BUN 11  CREATININE 0.85  CALCIUM 9.5   PT/INR  Recent Labs  05/06/16 1219  LABPROT 13.3  INR 1.01     Recent Labs Lab 05/06/16 0745  AST 18  ALT 17  ALKPHOS 96  BILITOT 0.5  PROT 7.3  ALBUMIN 4.2     Lipase     Component Value Date/Time   LIPASE 25 05/06/2016 0745     Studies/Results: Ct Abdomen Pelvis W Contrast  Result Date: 05/06/2016 CLINICAL DATA:  Severe abdominal pain and vomiting beginning this morning. EXAM: CT ABDOMEN AND PELVIS WITH CONTRAST TECHNIQUE: Multidetector CT imaging of the abdomen and pelvis was performed using the standard protocol following  bolus administration of intravenous contrast. CONTRAST:  129mL ISOVUE-300 IOPAMIDOL (ISOVUE-300) INJECTION 61% COMPARISON:  None. FINDINGS: Lower Chest: Bilateral lower lobe atelectasis. Hepatobiliary: Heterogeneously enhancing mass is seen in the central right hepatic lobe involving segments 7 and 8. This measures 4.5 x 4.5 cm on image 18/2. No other liver masses identified. Gallbladder is unremarkable. No evidence of biliary ductal dilatation. Pancreas:  No mass or inflammatory changes. Spleen: Within normal limits in size and appearance. Adrenals/Urinary Tract: No masses identified. 3 mm calculus in midpole of right kidney. No evidence of hydronephrosis. Stomach/Bowel: A moderate amount of free intraperitoneal air is seen within the abdomen. This appears to be due to a perforated ulcer involving the duodenal bulb. Small and large bowel are unremarkable in appearance. Vascular/Lymphatic: No pathologically enlarged lymph nodes. No abdominal aortic aneurysm. Aortic atherosclerosis. Reproductive: A 3.5 cm subserosal fibroid is seen arising the right posterior corpus. Adnexal regions are unremarkable. Tiny amount of free fluid noted within pelvis. Other:  None. Musculoskeletal:  No suspicious bone lesions identified. IMPRESSION: Moderate amount of free intraperitoneal air, which appears to be due to perforated ulcer involving the duodenal bulb. 4.5 cm heterogeneously enhancing lesion in the central right hepatic lobe. This is suspicious for hepatic neoplasm, with abscess considered less likely. Consider percutaneous needle aspiration/biopsy, or nonemergent abdomen MRI without and with contrast when patient is able to cooperate with breath-holding. Bilateral lower lobe atelectasis. Incidental findings including  nonobstructing right renal calculus and 3.5 cm posterior uterine fibroid. Critical Value/emergent results were called by telephone at the time of interpretation on 05/06/2016 at 10:03 am to Dr. Dayna Barker in the ED,  who verbally acknowledged these results. Electronically Signed   By: Earle Gell M.D.   On: 05/06/2016 10:10    Medications: . enoxaparin (LOVENOX) injection  40 mg Subcutaneous Q24H  . HYDROmorphone   Intravenous Q4H  . pantoprazole (PROTONIX) IV  40 mg Intravenous Q12H  . piperacillin-tazobactam (ZOSYN)  IV  3.375 g Intravenous Q8H   . 0.9 % NaCl with KCl 40 mEq / L 125 mL/hr (05/07/16 [id number])    Assessment/Plan PERFORATED duodenal ULCER Status post diagnostic laparoscopy, exploratory laparotomy, repair of perforated duodenal ulcer with Phillip Heal patch, 05/06/16 Dr. Greer Pickerel  4.5 cm central right hepatic lobe suspicious for neoplasm Nonobstructing right renal calculus/3.5 cm posterior uterine fibroid History gastric ulcer Hypertension Tobacco use Hyperlipidemia Anxiety FEN:  IV fluids/NPO ID:  Zosyn 05/06/16 =>> day 2 DVT:  Lovenox    Plan:  DC Foley, start to mobilize in the room today continue antibiotics.  We'll discuss plans for evaluation of 4.5 cm right hepatic lobe. Dr. Lucia Gaskins is going to discuss this with them also.    LOS: 1 day    JENNINGS,WILLARD 05/07/2016 [phone]  Agree with above. She is doing okay from the perf ulcer.  She also has lesion in liver.  This will need further eval.  Note, she did have Hepatitis C, but was treated for this about 2 years ago. I gave them a copy of her CT scan report. She also did not know that she had kidney stones. Her husband is in the room.  Alphonsa Overall, MD, [facility] Pager: [phone] Office phone:  [phone]

## 2016-05-07 NOTE — Op Note (Signed)
NAMEZAIDYN, BARTUNEK NO.:  [id number]  MEDICAL RECORD NO.:  [id number]  LOCATION:                                 FACILITY:  PHYSICIAN:  Leighton Ruff. Redmond Pulling, MD, FACSDATE OF BIRTH:  07/04/2055  DATE OF PROCEDURE:  05/06/2016 DATE OF DISCHARGE:                              OPERATIVE REPORT   PREOPERATIVE DIAGNOSIS:  Perforated ulcer.  POSTOPERATIVE DIAGNOSIS:  Perforated duodenal ulcer.  PROCEDURE:  Diagnostic laparoscopy converted to exploratory laparotomy, repair of perforated duodenal ulcer with Phillip Heal patch.  SURGEON:  Leighton Ruff. Redmond Pulling, MD, FACS.  ASSISTANT SURGEON:  Dr. Gurney Maxin.  ANESTHESIA:  General.  ESTIMATED BLOOD LOSS:  Minimal.  SPECIMEN:  None.  DRAINS:  Round 18-French JP drain in the right upper quadrant over the ulcer repair site, nasogastric tube, Foley catheter.  INDICATIONS FOR PROCEDURE:  The patient is a 61 year old female who presented to the emergency room a short time ago complaining of acute onset of the abdominal pain this morning.  It was associated with vomiting in the emergency room.  Imaging revealed pneumoperitoneum and findings suggestive of perforated duodenal ulcer.  She was also found to have a right hepatic central mass.  She had guarding on exam and was somewhat ill appearing.  I recommended laparoscopy with possible conversion to exploratory laparotomy for repair of the ulcer.  We discussed at length the risks and benefits of procedure including, but not limited to bleeding, infection, need for additional procedures, leak, incisional hernia, recurrent, blood clot formation, prolonged hospitalization, cardiac and pulmonary events perioperatively, as well as typical recovery course.  We also discussed the finding of the hepatic mass.  I told her we would need to follow up with that as an outpatient.  Priority was the perforated viscus.  DESCRIPTION OF PROCEDURE:  She was taken urgently to the OR 1 and  placed supine on the operating room table.  General endotracheal anesthesia was established.  Sequential compression devices were placed.  A Foley catheter was placed.  Her arms were tucked at her sides with the appropriate padding.  She had received Zosyn.  A surgical time-out was performed.  Her abdomen was prepped and draped in the usual standard surgical fashion with ChloraPrep.  Local was infiltrated in the supraumbilical position.  Next, a small supraumbilical incision was made with a #11 blade.  The fascia was grasped and lifted anteriorly and the fascia was incised.  0 Vicryl was placed around the fascial edges followed by a 12-mm Hasson trocar, and pneumoperitoneum was smoothly established up to a patient pressure of 15 mmHg.  The laparoscope was advanced and the abdominal cavity was surveilled.  There was gross inflammation in the right upper quadrant.  The falciform ligament was grossly indurated and inflamed.  There was some purulence in the right upper quadrant.  A 5-mm trocar was placed in the left mid abdomen under direct visualization.  There was fair amount of omentum that was somewhat tethered and stuck just past the distal stomach. I was unable to lift it off with a grasper or bluntly dissect it off with suction irrigator catheter.  At this point, I did not feel  laparoscopic exploration was going to be safe since this was going to be in the duodenal bulb.  I felt it was safest to convert to an open.  Pneumoperitoneum was relieved.  The Hasson trocar was removed. The supraumbilical incision was extended for about 4-5 more inches with a 10 blade.  The subcutaneous tissue was divided with electrocautery, and the fascia was incised and abdominal cavity was entered more.  The pursestring suture was removed.  There was purulent fluid in the upper abdomen, which was evacuated with suction.  I identified the perforation in the duodenal bulb.  It was just past the pylorus.  It  was approximately 3 mm in size.  The edges appeared viable and not completely irregular.  There was no gross palpable mass in the stomach. The nasogastric tube was in good position.  I elected to primarily repair the ulcer with 3 interrupted 3-0 silk sutures.  I did leave 1 with long tail, so I could bring up the tongue of omentum.  I isolated some of the omentum off the transverse colon.  Several interrupted 3-0 silk sutures were placed in the transverse colon where the omentum was somewhat tethered to the transverse colon.  This allowed me to create a tongue of omentum to bring up to the ulcer repair site.  The tongue of omentum was tied down and secured, and a typical Phillip Heal patch correction was tied down and secured with the extra tails of suture that had been left in place.  One additional interrupted suture was placed between the stomach and the omentum.  I then irrigated the abdomen with 3 L of saline.  I then placed Evicel tissue sealant over the area of repair, and round drain was then brought out through the left midabdominal trocar site, and the drain was placed in the right upper quadrant above and posterior to the perforated ulcer repair.  It was secured to the skin with a 2-0 nylon.  I did palpate the liver.  I could not feel a discrete mass.  On imaging, it was in the central posterior right hepatic lobe.  The fascia was reapproximated with a #1 looped PDS 1 from above and 1 from below.  Subcutaneous tissue was left open and packed with moist gauze followed by dry gauze.  The patient was extubated and taken to recovery room in stable condition.  COUNTS:  All needle, instrument, and sponge counts correct x2.  COMPLICATIONS:  There were no immediate complications.  The patient tolerated the procedure well.  An assistant was utilized/required to aid in the exposure of the anatomy and assist in the repair   Leighton Ruff. Redmond Pulling, MD, FACS     EMW/MEDQ  D:  05/06/2016  T:   05/07/2016  Job:  [id number]

## 2016-05-08 MED ORDER — PHENOL 1.4 % MT LIQD
1.0000 | OROMUCOSAL | Status: DC | PRN
  Administered 2016-05-08: 1 via OROMUCOSAL
  Filled 2016-05-08: qty 177

## 2016-05-08 NOTE — Progress Notes (Signed)
2 Days Post-Op  Subjective: She is uncomfortable with the NG, and being encumbered with all the tubes.  NG not working when I went in but is now.  Incision looks fine and the drain is serosanguinous.   Objective: Vital signs in last 24 hours: Temp:  [98.9 F (37.2 C)-99.6 F (37.6 C)] 99.6 F (37.6 C) (01/31 0438) Pulse Rate:  [79-92] 86 (01/31 0438) Resp:  [18-24] 24 (01/31 0824) BP: (144-168)/(74-87) 155/77 (01/31 0438) SpO2:  [95 %-99 %] 99 % (01/31 0824) Last BM Date: 05/05/16 NPO 2300 IV 700 urine recorded 20 from drain TM 99.6, VSS No labs this AM  Intake/Output from previous day: 01/30 0701 - 01/31 0700 In: 2278.4 [I.V.:2088.4; IV Piggyback:190] Out: 920 [Urine:700; Emesis/NG output:200; Drains:20] Intake/Output this shift: No intake/output data recorded.  General appearance: alert, cooperative and no distress Resp: clear to auscultation bilaterally GI: Soft, sore, incision okay. JP drainage is serosanguineous.  Lab Results:   Recent Labs  05/06/16 0745 05/07/16 1040  WBC 11.7* 11.6*  HGB 13.5 12.9  HCT 40.6 38.2  PLT 164 133*    BMET  Recent Labs  05/06/16 0745 05/07/16 1040  NA 141 136  K 3.2* 3.8  CL 106 104  CO2 26 23  GLUCOSE 205* 109*  BUN 11 6  CREATININE 0.85 0.78  CALCIUM 9.5 9.1   PT/INR  Recent Labs  05/06/16 1219  LABPROT 13.3  INR 1.01     Recent Labs Lab 05/06/16 0745  AST 18  ALT 17  ALKPHOS 96  BILITOT 0.5  PROT 7.3  ALBUMIN 4.2     Lipase     Component Value Date/Time   LIPASE 25 05/06/2016 0745     Studies/Results: Ct Abdomen Pelvis W Contrast  Result Date: 05/06/2016 CLINICAL DATA:  Severe abdominal pain and vomiting beginning this morning. EXAM: CT ABDOMEN AND PELVIS WITH CONTRAST TECHNIQUE: Multidetector CT imaging of the abdomen and pelvis was performed using the standard protocol following bolus administration of intravenous contrast. CONTRAST:  175mL ISOVUE-300 IOPAMIDOL (ISOVUE-300) INJECTION  61% COMPARISON:  None. FINDINGS: Lower Chest: Bilateral lower lobe atelectasis. Hepatobiliary: Heterogeneously enhancing mass is seen in the central right hepatic lobe involving segments 7 and 8. This measures 4.5 x 4.5 cm on image 18/2. No other liver masses identified. Gallbladder is unremarkable. No evidence of biliary ductal dilatation. Pancreas:  No mass or inflammatory changes. Spleen: Within normal limits in size and appearance. Adrenals/Urinary Tract: No masses identified. 3 mm calculus in midpole of right kidney. No evidence of hydronephrosis. Stomach/Bowel: A moderate amount of free intraperitoneal air is seen within the abdomen. This appears to be due to a perforated ulcer involving the duodenal bulb. Small and large bowel are unremarkable in appearance. Vascular/Lymphatic: No pathologically enlarged lymph nodes. No abdominal aortic aneurysm. Aortic atherosclerosis. Reproductive: A 3.5 cm subserosal fibroid is seen arising the right posterior corpus. Adnexal regions are unremarkable. Tiny amount of free fluid noted within pelvis. Other:  None. Musculoskeletal:  No suspicious bone lesions identified. IMPRESSION: Moderate amount of free intraperitoneal air, which appears to be due to perforated ulcer involving the duodenal bulb. 4.5 cm heterogeneously enhancing lesion in the central right hepatic lobe. This is suspicious for hepatic neoplasm, with abscess considered less likely. Consider percutaneous needle aspiration/biopsy, or nonemergent abdomen MRI without and with contrast when patient is able to cooperate with breath-holding. Bilateral lower lobe atelectasis. Incidental findings including nonobstructing right renal calculus and 3.5 cm posterior uterine fibroid. Critical Value/emergent results were called by  telephone at the time of interpretation on 05/06/2016 at 10:03 am to Dr. Dayna Barker in the ED, who verbally acknowledged these results. Electronically Signed   By: Earle Gell M.D.   On: 05/06/2016  10:10    Medications: . enoxaparin (LOVENOX) injection  40 mg Subcutaneous Q24H  . HYDROmorphone   Intravenous Q4H  . pantoprazole (PROTONIX) IV  40 mg Intravenous Q12H  . piperacillin-tazobactam (ZOSYN)  IV  3.375 g Intravenous Q8H    Assessment/Plan PERFORATED duodenal ULCER Status post diagnostic laparoscopy, exploratory laparotomy, repair of perforated duodenal ulcer with Phillip Heal patch, 05/06/16 Dr. Greer Pickerel Hx of hepatitis C with treatment 4.5 cm central right hepatic lobe suspicious for neoplasm Nonobstructing right renal calculus/3.5 cm posterior uterine fibroid History gastric ulcer Hypertension Tobacco use Hyperlipidemia Anxiety FEN:  IV fluids/NPO ID:  Zosyn 05/06/16 =>> day 3 DVT:  Lovenox   Plan: Mobilize more today, continue NG drainage IV fluids and IV antibiotics.    LOS: 2 days    Ledarius Leeson 05/08/2016 [phone]

## 2016-05-09 ENCOUNTER — Inpatient Hospital Stay (HOSPITAL_COMMUNITY): Payer: Federal, State, Local not specified - PPO

## 2016-05-09 LAB — BASIC METABOLIC PANEL
Anion gap: 11 (ref 5–15)
BUN: 8 mg/dL (ref 6–20)
CALCIUM: 9.1 mg/dL (ref 8.9–10.3)
CHLORIDE: 102 mmol/L (ref 101–111)
CO2: 21 mmol/L — AB (ref 22–32)
CREATININE: 0.81 mg/dL (ref 0.44–1.00)
GFR calc non Af Amer: >60 mL/min (ref >60)
Glucose, Bld: 96 mg/dL (ref 65–99)
Potassium: 4 mmol/L (ref 3.5–5.1)
SODIUM: 134 mmol/L — AB (ref 135–145)

## 2016-05-09 LAB — CBC
HCT: 39.5 % (ref 36.0–46.0)
HEMOGLOBIN: 13.3 g/dL (ref 12.0–15.0)
MCH: 29.3 pg (ref 26.0–34.0)
MCHC: 33.7 g/dL (ref 30.0–36.0)
MCV: 87 fL (ref 78.0–100.0)
Platelets: 155 10*3/uL (ref 150–400)
RBC: 4.54 MIL/uL (ref 3.87–5.11)
RDW: 12.1 % (ref 11.5–15.5)
WBC: 8.5 10*3/uL (ref 4.0–10.5)

## 2016-05-09 MED ORDER — IOPAMIDOL (ISOVUE-300) INJECTION 61%
150.0000 mL | Freq: Once | INTRAVENOUS | Status: AC | PRN
  Administered 2016-05-09: 116 mL via ORAL

## 2016-05-09 MED ORDER — IOPAMIDOL (ISOVUE-300) INJECTION 61%
INTRAVENOUS | Status: AC
  Administered 2016-05-09: 116 mL via ORAL
  Filled 2016-05-09: qty 150

## 2016-05-09 NOTE — Progress Notes (Signed)
3 Days Post-Op  Subjective: Over all she is doing well, not much NG drainage, abdominal wound looks fine.  Drain is serous fluid.  No distension, some flatus.  Objective: Vital signs in last 24 hours: Temp:  [98.1 F (36.7 C)-99.4 F (37.4 C)] 98.6 F (37 C) (02/01 0555) Pulse Rate:  [75-89] 75 (02/01 0555) Resp:  [16-28] 22 (02/01 0555) BP: (148-164)/(60-81) 155/65 (02/01 0555) SpO2:  [97 %-100 %] 100 % (02/01 0555) Last BM Date: 05/05/16 60 PO 1950 IV 925 urine recorded 550 per NG 35 from the drain Afebrile, VSS Labs OK Intake/Output from previous day: 01/31 0701 - 02/01 0700 In: 2200 [P.O.:60; I.V.:1950; NG/GT:90; IV Piggyback:100] Out: 1510 [Urine:925; Emesis/NG output:550; Drains:35] Intake/Output this shift: No intake/output data recorded.  General appearance: alert, cooperative and no distress Resp: clear to auscultation bilaterally GI: Soft, sore. Wound looks fine. No distention. Drainage is serous from the JP. Positive for flatus.  Lab Results:   Recent Labs  05/07/16 1040 05/09/16 0450  WBC 11.6* 8.5  HGB 12.9 13.3  HCT 38.2 39.5  PLT 133* 155    BMET  Recent Labs  05/07/16 1040 05/09/16 0450  NA 136 134*  K 3.8 4.0  CL 104 102  CO2 23 21*  GLUCOSE 109* 96  BUN 6 8  CREATININE 0.78 0.81  CALCIUM 9.1 9.1   PT/INR  Recent Labs  05/06/16 1219  LABPROT 13.3  INR 1.01     Recent Labs Lab 05/06/16 0745  AST 18  ALT 17  ALKPHOS 96  BILITOT 0.5  PROT 7.3  ALBUMIN 4.2     Lipase     Component Value Date/Time   LIPASE 25 05/06/2016 0745     Studies/Results: No results found.  Medications: . enoxaparin (LOVENOX) injection  40 mg Subcutaneous Q24H  . HYDROmorphone   Intravenous Q4H  . pantoprazole (PROTONIX) IV  40 mg Intravenous Q12H  . piperacillin-tazobactam (ZOSYN)  IV  3.375 g Intravenous Q8H    Assessment/Plan PERFORATED duodenal ULCER Status post diagnostic laparoscopy, exploratory laparotomy, repair of perforated  duodenal ulcer with Phillip Heal patch, 05/06/16 Dr. Greer Pickerel POD 3 Hx of hepatitis C with treatment 4.5 cm central right hepatic lobe suspicious for neoplasm Nonobstructing right renal calculus/3.5 cm posterior uterine fibroid History gastric ulcer Hypertension Tobacco use Hyperlipidemia Anxiety FEN: IV fluids/NPO ID: Zosyn 05/06/16 =>> day 4 DVT: Lovenox  Plan: Upper GI with water-soluble contrast look for leak from perforated duodenal ulcer status post Phillip Heal patch.   UGI this AM shows:  Focused, single-contrast exam performed with patient in supine and minimally oblique positions. Normal appearance of the stomach, without gastric outlet obstruction. Normal appearance of the duodenal bulb and proximal portion of the duodenal C-loop. No evidence of contrast extravasation.  We are clamping the NG today and if she has no issue remove tomorrow.  Start sips of clears from the floor stock tonight and if OK advance tomorrow.     LOS: 3 days    Kristen Everett 05/09/2016 [phone]

## 2016-05-10 MED ORDER — OXYCODONE-ACETAMINOPHEN 5-325 MG PO TABS
1.0000 | ORAL_TABLET | ORAL | Status: DC | PRN
  Administered 2016-05-11 – 2016-05-12 (×5): 2 via ORAL
  Filled 2016-05-10 (×5): qty 2

## 2016-05-10 MED ORDER — ATENOLOL 25 MG PO TABS
12.5000 mg | ORAL_TABLET | Freq: Two times a day (BID) | ORAL | Status: DC
  Administered 2016-05-10 – 2016-05-11 (×3): 12.5 mg via ORAL
  Filled 2016-05-10 (×3): qty 1

## 2016-05-10 MED ORDER — HYDROMORPHONE HCL 2 MG/ML IJ SOLN
0.5000 mg | INTRAMUSCULAR | Status: DC | PRN
  Administered 2016-05-10: 1 mg via INTRAVENOUS
  Filled 2016-05-10: qty 1

## 2016-05-10 MED ORDER — ACETAMINOPHEN 500 MG PO TABS
1000.0000 mg | ORAL_TABLET | Freq: Four times a day (QID) | ORAL | Status: DC
  Administered 2016-05-10 (×3): 1000 mg via ORAL
  Filled 2016-05-10 (×4): qty 2

## 2016-05-10 NOTE — Progress Notes (Signed)
Wasted 17 ml pca Dilaudid in sink with Mayra Neer, RN

## 2016-05-10 NOTE — Progress Notes (Signed)
4 Days Post-Op  Subjective: No issues with nausea or vomiting with NG clamped yesterday. Positive bowel sounds and flatus. Incision looks good. Drainage is serosanguineous.   Objective: Vital signs in last 24 hours: Temp:  [98.4 F (36.9 C)-99.1 F (37.3 C)] 98.7 F (37.1 C) (02/02 0523) Pulse Rate:  [65-75] 70 (02/02 0523) Resp:  [15-22] 22 (02/02 0748) BP: (152-161)/(69-74) 161/70 (02/02 0523) SpO2:  [94 %-98 %] 98 % (02/02 0748) Last BM Date: 05/05/16 120 PO recorded 2400 IV Urine 1550 NG 250 Drain 25 Intake/Output from previous day: 02/01 0701 - 02/02 0700 In: 2557.1 [P.O.:120; I.V.:2177.1; NG/GT:60; IV Piggyback:200] Out: [id number] [Urine:1550; Emesis/NG output:250; Drains:25] Intake/Output this shift: No intake/output data recorded.  General appearance: alert, cooperative and no distress Resp: clear to auscultation bilaterally GI: Soft, sore, incision looks good. JP drain is clear serous fluid. Positive bowel sounds and flatus no BM so far.  Lab Results:   Recent Labs  05/07/16 1040 05/09/16 0450  WBC 11.6* 8.5  HGB 12.9 13.3  HCT 38.2 39.5  PLT 133* 155    BMET  Recent Labs  05/07/16 1040 05/09/16 0450  NA 136 134*  K 3.8 4.0  CL 104 102  CO2 23 21*  GLUCOSE 109* 96  BUN 6 8  CREATININE 0.78 0.81  CALCIUM 9.1 9.1   PT/INR No results for input(s): LABPROT, INR in the last 72 hours.   Recent Labs Lab 05/06/16 0745  AST 18  ALT 17  ALKPHOS 96  BILITOT 0.5  PROT 7.3  ALBUMIN 4.2     Lipase     Component Value Date/Time   LIPASE 25 05/06/2016 0745     Studies/Results: Dg Duanne Limerick W/water Sol Cm  Result Date: 05/09/2016 CLINICAL DATA:  Status post Phillip Heal patch repair for perforated duodenal bulb ulcer on 01/29. EXAM: WATER SOLUBLE UPPER GI SERIES TECHNIQUE: Single-column upper GI series was performed using water soluble contrast. CONTRAST:  116 cc of Isovue-300 COMPARISON:  CT of 05/06/2016. FLUOROSCOPY TIME:  Fluoroscopy Time:  4 minutes and 0  seconds Number of Acquired Spot Images: 2 FINDINGS: Preprocedure scout film demonstrates a nasogastric tube at the gastric body. No gross free intraperitoneal air. Right hemidiaphragm elevation. Focused, single-contrast exam performed with patient in supine and minimally oblique positions. Normal appearance of the stomach, without gastric outlet obstruction. Normal appearance of the duodenal bulb and proximal portion of the duodenal C-loop. No evidence of contrast extravasation. IMPRESSION: No evidence of duodenal perforation. Electronically Signed   By: Abigail Miyamoto M.D.   On: 05/09/2016 12:57   Prior to Admission medications   Medication Sig Start Date End Date Taking? Authorizing Provider  amLODipine (NORVASC) 10 MG tablet Take 10 mg by mouth daily. 04/02/16  Yes Historical Provider, MD  atenolol (TENORMIN) 25 MG tablet Take 25 mg by mouth daily.  03/08/16  Yes Historical Provider, MD  atorvastatin (LIPITOR) 20 MG tablet Take 20 mg by mouth daily.  04/06/16  Yes Historical Provider, MD    Medications: . enoxaparin (LOVENOX) injection  40 mg Subcutaneous Q24H  . HYDROmorphone   Intravenous Q4H  . pantoprazole (PROTONIX) IV  40 mg Intravenous Q12H  . piperacillin-tazobactam (ZOSYN)  IV  3.375 g Intravenous Q8H    Assessment/Plan PERFORATED duodenal ULCER Status post diagnostic laparoscopy, exploratory laparotomy, repair of perforated duodenal ulcer with Phillip Heal patch, 05/06/16 Dr. Greer Pickerel POD 4 Hx of hepatitis C with treatment 4.5 cm central right hepatic lobe suspicious for neoplasm Nonobstructing right renal calculus/3.5 cm posterior  uterine fibroid History gastric ulcer Hypertension Tobacco use Hyperlipidemia Anxiety FEN: IV fluids/NPO ID: Zosyn 05/06/16 =>>day 5 DVT: Lovenox   Plan: I have removed the NG. We will put her on clear liquids today. Discontinue the PCA and oral by mouth pain medicines, along with IV pain medicines. Continue to mobilize. Restart the Tenormin at half  doses twice a day. We will add Norvasc back as needed.    LOS: 4 days    Kristen Everett 05/10/2016 [phone]

## 2016-05-10 NOTE — Progress Notes (Signed)
RN witnessed Kristen Everett waste 75mL ofPCA  Dilaudid into sink.

## 2016-05-11 MED ORDER — SODIUM CHLORIDE 0.9% FLUSH
3.0000 mL | Freq: Two times a day (BID) | INTRAVENOUS | Status: DC
  Administered 2016-05-11 – 2016-05-12 (×3): 3 mL via INTRAVENOUS

## 2016-05-11 MED ORDER — SODIUM CHLORIDE 0.9% FLUSH: 3.0000 mL | INTRAVENOUS | Status: DC | PRN

## 2016-05-11 MED ORDER — AMLODIPINE BESYLATE 10 MG PO TABS
10.0000 mg | ORAL_TABLET | Freq: Every day | ORAL | Status: DC
  Administered 2016-05-11 – 2016-05-12 (×2): 10 mg via ORAL
  Filled 2016-05-11 (×2): qty 1

## 2016-05-11 MED ORDER — ATORVASTATIN CALCIUM 20 MG PO TABS
20.0000 mg | ORAL_TABLET | Freq: Every day | ORAL | Status: DC
  Administered 2016-05-11 – 2016-05-12 (×2): 20 mg via ORAL
  Filled 2016-05-11 (×2): qty 1

## 2016-05-11 MED ORDER — ATENOLOL 25 MG PO TABS
25.0000 mg | ORAL_TABLET | Freq: Every day | ORAL | Status: DC
  Administered 2016-05-11: 12.5 mg via ORAL
  Administered 2016-05-12: 25 mg via ORAL
  Filled 2016-05-11 (×2): qty 1

## 2016-05-11 MED ORDER — PANTOPRAZOLE SODIUM 40 MG PO TBEC
40.0000 mg | DELAYED_RELEASE_TABLET | Freq: Two times a day (BID) | ORAL | Status: DC
  Administered 2016-05-11 – 2016-05-12 (×2): 40 mg via ORAL
  Filled 2016-05-11 (×2): qty 1

## 2016-05-11 MED ORDER — MORPHINE SULFATE (PF) 2 MG/ML IV SOLN: 2.0000 mg | INTRAVENOUS | Status: DC | PRN

## 2016-05-11 NOTE — Progress Notes (Signed)
5 Days Post-Op  Subjective: Tolerating CLD, ambulating.  Pain controlled.  Having BM's   Objective: Vital signs in last 24 hours: Temp:  [98.2 F (36.8 C)-99.1 F (37.3 C)] 98.7 F (37.1 C) (02/03 1009) Pulse Rate:  [56-98] 98 (02/03 1009) Resp:  [18-20] 18 (02/03 1009) BP: (142-173)/(69-77) 155/69 (02/03 1009) SpO2:  [97 %-99 %] 98 % (02/03 1009) Last BM Date: 05/10/16  Intake/Output from previous day: 02/02 0701 - 02/03 0700 In: 2872.3 [P.O.:926; I.V.:1846.3; IV Piggyback:100] Out: 820 [Urine:800; Drains:20] Intake/Output this shift: No intake/output data recorded.  General appearance: alert, cooperative and no distress Resp: clear to auscultation bilaterally GI: Soft, incision looks good. JP drain is clear serous fluid.   Lab Results:   Recent Labs  05/09/16 0450  WBC 8.5  HGB 13.3  HCT 39.5  PLT 155    BMET  Recent Labs  05/09/16 0450  NA 134*  K 4.0  CL 102  CO2 21*  GLUCOSE 96  BUN 8  CREATININE 0.81  CALCIUM 9.1   PT/INR No results for input(s): LABPROT, INR in the last 72 hours.   Recent Labs Lab 05/06/16 0745  AST 18  ALT 17  ALKPHOS 96  BILITOT 0.5  PROT 7.3  ALBUMIN 4.2     Lipase     Component Value Date/Time   LIPASE 25 05/06/2016 0745     Studies/Results: Dg Duanne Limerick W/water Sol Cm  Result Date: 05/09/2016 CLINICAL DATA:  Status post Phillip Heal patch repair for perforated duodenal bulb ulcer on 01/29. EXAM: WATER SOLUBLE UPPER GI SERIES TECHNIQUE: Single-column upper GI series was performed using water soluble contrast. CONTRAST:  116 cc of Isovue-300 COMPARISON:  CT of 05/06/2016. FLUOROSCOPY TIME:  Fluoroscopy Time:  4 minutes and 0 seconds Number of Acquired Spot Images: 2 FINDINGS: Preprocedure scout film demonstrates a nasogastric tube at the gastric body. No gross free intraperitoneal air. Right hemidiaphragm elevation. Focused, single-contrast exam performed with patient in supine and minimally oblique positions. Normal  appearance of the stomach, without gastric outlet obstruction. Normal appearance of the duodenal bulb and proximal portion of the duodenal C-loop. No evidence of contrast extravasation. IMPRESSION: No evidence of duodenal perforation. Electronically Signed   By: Abigail Miyamoto M.D.   On: 05/09/2016 12:57   Prior to Admission medications   Medication Sig Start Date End Date Taking? Authorizing Provider  amLODipine (NORVASC) 10 MG tablet Take 10 mg by mouth daily. 04/02/16  Yes Historical Provider, MD  atenolol (TENORMIN) 25 MG tablet Take 25 mg by mouth daily.  03/08/16  Yes Historical Provider, MD  atorvastatin (LIPITOR) 20 MG tablet Take 20 mg by mouth daily.  04/06/16  Yes Historical Provider, MD    Medications: . acetaminophen  1,000 mg Oral Q6H  . atenolol  12.5 mg Oral BID  . enoxaparin (LOVENOX) injection  40 mg Subcutaneous Q24H  . pantoprazole (PROTONIX) IV  40 mg Intravenous Q12H  . piperacillin-tazobactam (ZOSYN)  IV  3.375 g Intravenous Q8H  . sodium chloride flush  3 mL Intravenous Q12H    Assessment/Plan PERFORATED duodenal ULCER Status post diagnostic laparoscopy, exploratory laparotomy, repair of perforated duodenal ulcer with Phillip Heal patch, 05/06/16 Dr. Greer Pickerel POD 5 Hx of hepatitis C with treatment 4.5 cm central right hepatic lobe suspicious for neoplasm Nonobstructing right renal calculus/3.5 cm posterior uterine fibroid History gastric ulcer Hypertension Tobacco use Hyperlipidemia Anxiety FEN: FLD; SL IVF's ID: Zosyn 05/06/16 =>>day 5.  Will stop today DVT: Lovenox   Plan: FLD today and  advance as tolerated. Continue to mobilize. Cont home meds.  D/C JP     LOS: 5 days    Kristen Everett C. [phone]

## 2016-05-12 MED ORDER — OXYCODONE-ACETAMINOPHEN 5-325 MG PO TABS: 1.0000 | ORAL_TABLET | ORAL | 0 refills | Status: DC | PRN

## 2016-05-12 MED ORDER — PANTOPRAZOLE SODIUM 40 MG PO TBEC: 40.0000 mg | DELAYED_RELEASE_TABLET | Freq: Two times a day (BID) | ORAL | 0 refills | Status: DC

## 2016-05-12 NOTE — Discharge Summary (Signed)
Physician Discharge Summary  Patient ID: Kristen Everett MRN: [id number] DOB/AGE: 60-16-1957 60 y.o.  Admit date: 05/06/2016 Discharge date: 05/12/2016  Admission Diagnoses: perforated viscus  Discharge Diagnoses:  Active Problems:   Perforated ulcer   Discharged Condition: good  Hospital Course: Pt admitted after surgical repair of perforated ulcer.  Her NG was left in place for several days then removed.  Her diet was then advanced slowly.  She tolerated this well.  By POD 5 she was tolerating a diet and her pain was controlled with PO meds.  She was ambulating well and having bowel function  Consults: None  Significant Diagnostic Studies: labs: cbc, chemistry  Treatments: IV hydration, antibiotics: Zosyn, analgesia: acetaminophen w/ codeine and surgery: graham patch  Discharge Exam: Blood pressure (!) 150/69, pulse 60, temperature 98.2 F (36.8 C), temperature source Oral, resp. rate 19, height 5\' 6"  (1.676 m), weight 68.9 kg (151 lb 14.4 oz), SpO2 98 %. General appearance: alert and cooperative GI: soft, nontender Incision/Wound: clean, dry  Disposition: Final discharge disposition not confirmed   Allergies as of 05/12/2016   No Known Allergies     Medication List    TAKE these medications   amLODipine 10 MG tablet Commonly known as:  NORVASC Take 10 mg by mouth daily.   atenolol 25 MG tablet Commonly known as:  TENORMIN Take 25 mg by mouth daily.   atorvastatin 20 MG tablet Commonly known as:  LIPITOR Take 20 mg by mouth daily.   oxyCODONE-acetaminophen 5-325 MG tablet Commonly known as:  PERCOCET/ROXICET Take 1-2 tablets by mouth every 4 (four) hours as needed for moderate pain.   pantoprazole 40 MG tablet Commonly known as:  PROTONIX Take 1 tablet (40 mg total) by mouth 2 (two) times daily.      Follow-up Information    Gayland Curry, MD. Schedule an appointment as soon as possible for a visit in 2 week(s).   Specialty:  General Surgery Contact  information: [place] [address] [phone]           Signed: Rosario Adie [id number], [id number] AM

## 2016-05-12 NOTE — Discharge Instructions (Signed)
Your CT scan shows a 4.5 cm liver mass. This is concerning for neoplasm versus abscess versus other benign tumors. The need to have this followed up with either an MRI or repeat CT scan. I would discuss this with your primary provider to get it scheduled. If not you can follow up with the gastroenterologist to help you look into as well.   ABDOMINAL SURGERY: POST OP INSTRUCTIONS  1. DIET: Follow a light bland diet the first 24 hours after arrival home, such as soup, liquids, crackers, etc.  Be sure to include lots of fluids daily.  Avoid fast food or heavy meals as your are more likely to get nauseated.  Eat a low fat the next few days after surgery.   2. Take your usually prescribed home medications unless otherwise directed. 3. PAIN CONTROL: a. Pain is best controlled by a usual combination of three different methods TOGETHER: i. Ice/Heat ii. Over the counter pain medication iii. Prescription pain medication b. Most patients will experience some swelling and bruising around the incisions.  Ice packs or heating pads (30-60 minutes up to 6 times a day) will help. Use ice for the first few days to help decrease swelling and bruising, then switch to heat to help relax tight/sore spots and speed recovery.  Some people prefer to use ice alone, heat alone, alternating between ice & heat.  Experiment to what works for you.  Swelling and bruising can take several weeks to resolve.   c. It is helpful to take an over-the-counter pain medication regularly for the first few weeks.  Choose one of the following that works best for you: i. Naproxen (Aleve, etc)  Two 220mg  tabs twice a day ii. Ibuprofen (Advil, etc) Three 200mg  tabs four times a day (every meal & bedtime) iii. Acetaminophen (Tylenol, etc) 500-650mg  four times a day (every meal & bedtime) d. A  prescription for pain medication (such as oxycodone, hydrocodone, etc) should be given to you upon discharge.  Take your pain medication as prescribed.   i. If you are having problems/concerns with the prescription medicine (does not control pain, nausea, vomiting, rash, itching, etc), please call us [phone] to see if we need to switch you to a different pain medicine that will work better for you and/or control your side effect better. ii. If you need a refill on your pain medication, please contact your pharmacy.  They will contact our office to request authorization. Prescriptions will not be filled after 5 pm or on week-ends. 4. Avoid getting constipated.  Between the surgery and the pain medications, it is common to experience some constipation.  Increasing fluid intake and taking a fiber supplement (such as Metamucil, Citrucel, FiberCon, MiraLax, etc) 1-2 times a day regularly will usually help prevent this problem from occurring.  A mild laxative (prune juice, Milk of Magnesia, MiraLax, etc) should be taken according to package directions if there are no bowel movements after 48 hours.   5. Watch out for diarrhea.  If you have many loose bowel movements, simplify your diet to bland foods & liquids for a few days.  Stop any stool softeners and decrease your fiber supplement.  Switching to mild anti-diarrheal medications (Kayopectate, Pepto Bismol) can help.  If this worsens or does not improve, please call us. 6. Wash / shower every day.  You may shower over the incision / wound.  Avoid baths until the skin is fully healed.  Continue to shower over incision(s) after the dressing is off.  7. Remove your waterproof bandages 5 days after surgery.  You may leave the incision open to air.  You may replace a dressing/Band-Aid to cover the incision for comfort if you wish. 8. ACTIVITIES as tolerated:   a. You may resume regular (light) daily activities beginning the next day--such as daily self-care, walking, climbing stairs--gradually increasing activities as tolerated.  If you can walk 30 minutes without difficulty, it is safe to try more intense  activity such as jogging, treadmill, bicycling, low-impact aerobics, swimming, etc. b. Save the most intensive and strenuous activity for last such as sit-ups, heavy lifting, contact sports, etc  Refrain from any heavy lifting or straining until you are off narcotics for pain control.   c. DO NOT PUSH THROUGH PAIN.  Let pain be your guide: If it hurts to do something, don't do it.  Pain is your body warning you to avoid that activity for another week until the pain goes down. d. You may drive when you are no longer taking prescription pain medication, you can comfortably wear a seatbelt, and you can safely maneuver your car and apply brakes. e. Dennis Bast may have sexual intercourse when it is comfortable.  9. FOLLOW UP in our office a. Please call [facility] at [phone] to set up an appointment to see your surgeon in the office for a follow-up appointment approximately 1-2 weeks after your surgery. b. Make sure that you call for this appointment the day you arrive home to insure a convenient appointment time. 10. IF YOU HAVE DISABILITY OR FAMILY LEAVE FORMS, BRING THEM TO THE OFFICE FOR PROCESSING.  DO NOT GIVE THEM TO YOUR DOCTOR.   WHEN TO CALL us [phone]: 1. Poor pain control 2. Reactions / problems with new medications (rash/itching, nausea, etc)  3. Fever over 101.5 F (38.5 C) 4. Inability to urinate 5. Nausea and/or vomiting 6. Worsening swelling or bruising 7. Continued bleeding from incision. 8. Increased pain, redness, or drainage from the incision  The clinic staff is available to answer your questions during regular business hours (8:30am-5pm).  Please dont hesitate to call and ask to speak to one of our nurses for clinical concerns.   A surgeon from [facility] is always on call at the hospitals   If you have a medical emergency, go to the nearest emergency room or call 911.    [facility], [place], [place], [place], [place]  [address]  ? MAIN: [phone] ? TOLL FREE: [phone] ? FAX [phone] [email or web address]

## 2016-05-12 NOTE — Progress Notes (Addendum)
Discharge instructions gone over with patient. Home medications discussed. Prescriptions given. Follow up appointment to be made. Diet, activity, and incisional care discussed. Signs and symptoms of worsening condition and reasons to call the doctor discussed. Patient verbalized understanding of instructions.

## 2016-05-27 ENCOUNTER — Other Ambulatory Visit: Payer: Self-pay | Admitting: General Surgery

## 2016-05-27 DIAGNOSIS — R160 Hepatomegaly, not elsewhere classified: Secondary | ICD-10-CM

## 2016-06-11 ENCOUNTER — Ambulatory Visit
Admission: RE | Admit: 2016-06-11 | Discharge: 2016-06-11 | Disposition: A | Discharge status: Home / Self Care | Payer: Federal, State, Local not specified - PPO | Source: Ambulatory Visit | Attending: General Surgery | Admitting: General Surgery

## 2016-06-11 DIAGNOSIS — R160 Hepatomegaly, not elsewhere classified: Secondary | ICD-10-CM

## 2016-06-11 MED ORDER — GADOBENATE DIMEGLUMINE 529 MG/ML IV SOLN
13.0000 mL | Freq: Once | INTRAVENOUS | Status: AC | PRN
  Administered 2016-06-11: 13 mL via INTRAVENOUS

## 2016-07-03 ENCOUNTER — Ambulatory Visit (HOSPITAL_BASED_OUTPATIENT_CLINIC_OR_DEPARTMENT_OTHER): Payer: Federal, State, Local not specified - PPO | Admitting: Hematology

## 2016-07-03 ENCOUNTER — Encounter: Payer: Self-pay | Admitting: Hematology

## 2016-07-03 ENCOUNTER — Telehealth: Payer: Self-pay | Admitting: Hematology

## 2016-07-03 ENCOUNTER — Ambulatory Visit (HOSPITAL_BASED_OUTPATIENT_CLINIC_OR_DEPARTMENT_OTHER): Payer: Federal, State, Local not specified - PPO

## 2016-07-03 DIAGNOSIS — R160 Hepatomegaly, not elsewhere classified: Secondary | ICD-10-CM | POA: Diagnosis not present

## 2016-07-03 DIAGNOSIS — Z720 Tobacco use: Secondary | ICD-10-CM

## 2016-07-03 LAB — COMPREHENSIVE METABOLIC PANEL
ALBUMIN: 4.3 g/dL (ref 3.5–5.0)
ALK PHOS: 118 U/L (ref 40–150)
ALT: 59 U/L — AB (ref 0–55)
AST: 42 U/L — AB (ref 5–34)
Anion Gap: 9 mEq/L (ref 3–11)
BUN: 10.5 mg/dL (ref 7.0–26.0)
CO2: 23 mEq/L (ref 22–29)
CREATININE: 0.8 mg/dL (ref 0.6–1.1)
Calcium: 10 mg/dL (ref 8.4–10.4)
Chloride: 110 mEq/L — ABNORMAL HIGH (ref 98–109)
EGFR: >90 mL/min/{1.73_m2} (ref >90)
GLUCOSE: 110 mg/dL (ref 70–140)
POTASSIUM: 3.6 meq/L (ref 3.5–5.1)
SODIUM: 142 meq/L (ref 136–145)
Total Bilirubin: 0.31 mg/dL (ref 0.20–1.20)
Total Protein: 7.8 g/dL (ref 6.4–8.3)

## 2016-07-03 LAB — CBC WITH DIFFERENTIAL/PLATELET
BASO%: 0.2 % (ref 0.0–2.0)
BASOS ABS: 0 10*3/uL (ref 0.0–0.1)
EOS%: 1.6 % (ref 0.0–7.0)
Eosinophils Absolute: 0.2 10*3/uL (ref 0.0–0.5)
HCT: 40.1 % (ref 34.8–46.6)
HEMOGLOBIN: 13.4 g/dL (ref 11.6–15.9)
LYMPH#: 4.1 10*3/uL — AB (ref 0.9–3.3)
LYMPH%: 44.7 % (ref 14.0–49.7)
MCH: 29.7 pg (ref 25.1–34.0)
MCHC: 33.4 g/dL (ref 31.5–36.0)
MCV: 88.9 fL (ref 79.5–101.0)
MONO#: 0.8 10*3/uL (ref 0.1–0.9)
MONO%: 8.5 % (ref 0.0–14.0)
NEUT%: 45 % (ref 38.4–76.8)
NEUTROS ABS: 4.1 10*3/uL (ref 1.5–6.5)
Platelets: 157 10*3/uL (ref 145–400)
RBC: 4.51 10*6/uL (ref 3.70–5.45)
RDW: 13.1 % (ref 11.2–14.5)
WBC: 9.2 10*3/uL (ref 3.9–10.3)

## 2016-07-03 LAB — PROTIME-INR
INR: 1 — AB (ref 2.00–3.50)
Protime: 12 Seconds (ref 10.6–13.4)

## 2016-07-03 NOTE — Telephone Encounter (Signed)
Cld pt to schedule an appt to see Dr Burr Medico today, 3/28 at 230pm. Pt agreed to the appt date and time. Aware to arrive at the [facility] at 2pm for check-in. Pt voiced understanding.

## 2016-07-03 NOTE — Telephone Encounter (Signed)
Gave patient AVS and calender per 07/03/2016 los. [facility] to contact patient with US Biopsy and CT schedule.

## 2016-07-03 NOTE — Progress Notes (Signed)
[name]  Telephone:([phone]) [phone] Fax:([phone]) [facility] Note   Patient Care Team: Thurman Coyer, MD as PCP - General (Internal Medicine) 07/03/2016  Referring physician: Dr. Redmond Pulling  CHIEF COMPLAINTS/PURPOSE OF CONSULTATION:  Liver mass   HISTORY OF PRESENTING ILLNESS (07/03/2016):  Kristen Everett 61 y.o. female  originally presented to the ED on 05/06/2016 complaining of acute onset epigastric pain with vomiting. CT abdomen pelvis at that time showed a 4.5 cm heterogeneously enhancing lesion in the central right hepatic lobe. This is suspicious for hepatic neoplasm, with abscess considered less likely. There was also a moderate amount of free intraperitoneal air, which appears to be due to perforated ulcer involving the duodenal bulb. Duodenal ulcer was repaired with a patch on 05/06/2016.  Abdominal MRI on 06/11/2016 showed a large heterogeneously enhancing mass in the central right liver with invasion of right hepatic vein/IVC and right portal vein. There were multiple satellite lesions identified and other scattered hypervascular lesion showing early washout are seen elsewhere in the liver parenchyma. Also seen was a small right adrenal nodule.   She presents for consultation today with her husband. She is doing well overall. She originally presented with acute epigastric pain with abdominal cramping and sweating, then while in the ED she began to experience nausea. She has been told she had gastritis in the past. She denies any black stool or bleeding. She denies abdominal pain prior to 05/06/2016. She had not been eating well but has been gaining weight back. She reports fatigue. She denies fever or night sweats.  She was diagnosed with Hepatitis C many years ago and was treated with Harvoni about 4-5 years ago. She no longer follows with liver specialist but, continues to follow with her PCP. She does not know how she contracted Hepatis C. She never used  intravenous drugs or received blood. She was told she had a fatty liver while being treated for Hep C.   She has high blood pressure and takes Lipitor for cholesterol. Her mother had breast cancer and was diagnosed at age 44. She smokes less than 1/2 ppd and has been smoking about 20 years. She has been cutting back since she was hospitalized. States she has a heart murmur.   MEDICAL HISTORY:  Past Medical History:  Diagnosis Date  . Anxiety   . Family history of adverse reaction to anesthesia    " MY SISTER    WOKE UP "  . Hepatitis    HEPATITIS C    TREATED WITH HARVONI   . Hypertension   . Ulcer ([place]) 05/06/2016   PERFORATED DUODENAL ULCER (    SURGICAL HISTORY: Past Surgical History:  Procedure Laterality Date  . LAPAROSCOPY N/A 05/06/2016   Procedure: DIAGNOSTIC LAPAROSCOPY;  Surgeon: Greer Pickerel, MD;  Location: [facility];  Service: General;  Laterality: N/A;  . LAPAROTOMY N/A 05/06/2016   Procedure: EXPLORATORY LAPAROTOMY;  Surgeon: Greer Pickerel, MD;  Location: [facility];  Service: General;  Laterality: N/A;  . REPAIR OF PERFORATED ULCER N/A 05/06/2016   Procedure: REPAIR OF PERFORATED DUODENAL ULCER;  Surgeon: Greer Pickerel, MD;  Location: [facility];  Service: General;  Laterality: N/A;    SOCIAL HISTORY: Social History   Social History  . Marital status: Single    Spouse name: N/A  . Number of children: N/A  . Years of education: N/A   Occupational History  . Not on file.   Social History Main Topics  . Smoking status: Current  Every Day Smoker    Packs/day: 0.50    Years: 26.00    Types: Cigarettes  . Smokeless tobacco: Never Used  . Alcohol use No  . Drug use: No  . Sexual activity: Not on file   Other Topics Concern  . Not on file   Social History Narrative  . No narrative on file    FAMILY HISTORY: Family History  Problem Relation Age of Onset  . Cancer Mother 37    breast cancer    ALLERGIES:  has No Known Allergies.  MEDICATIONS:  Current Outpatient  Prescriptions  Medication Sig Dispense Refill  . amLODipine (NORVASC) 10 MG tablet Take 10 mg by mouth daily.    Marland Kitchen atenolol (TENORMIN) 25 MG tablet Take 25 mg by mouth daily.     Marland Kitchen atorvastatin (LIPITOR) 20 MG tablet Take 20 mg by mouth daily.      No current facility-administered medications for this visit.     REVIEW OF SYSTEMS:   Constitutional: Denies fevers, chills or abnormal night sweats (+) mild fatigue Eyes: Denies blurriness of vision, double vision or watery eyes Ears, nose, mouth, throat, and face: Denies mucositis or sore throat Respiratory: Denies cough, dyspnea or wheezes Cardiovascular: Denies palpitation, chest discomfort or lower extremity swelling Gastrointestinal:  Denies nausea, heartburn or change in bowel habits Skin: Denies abnormal skin rashes Lymphatics: Denies new lymphadenopathy or easy bruising Neurological:Denies numbness, tingling or new weaknesses Behavioral/Psych: Mood is stable, no new changes  All other systems were reviewed with the patient and are negative.  PHYSICAL EXAMINATION: ECOG PERFORMANCE STATUS: 0 - Asymptomatic  Vitals:   07/03/16 1427  BP: (!) 144/75  Pulse: 86  Resp: 18  Temp: 98.8 F (37.1 C)   Filed Weights   07/03/16 1427  Weight: 144 lb 6.4 oz (65.5 kg)    GENERAL:alert, no distress and comfortable SKIN: skin color, texture, turgor are normal, no rashes or significant lesions EYES: normal, conjunctiva are pink and non-injected, sclera clear OROPHARYNX:no exudate, no erythema and lips, buccal mucosa, and tongue normal  NECK: supple, thyroid normal size, non-tender, without nodularity LYMPH:  no palpable lymphadenopathy in the cervical, axillary or inguinal LUNGS: clear to auscultation and percussion with normal breathing effort HEART: regular rate & rhythm and no lower extremity edema (+) murmur heard ABDOMEN:abdomen soft, non-tender and normal bowel sounds. No hepatomegaly appreciated. (+) well healed midline Surgical  scar with mild tenderness. Musculoskeletal:no cyanosis of digits and no clubbing  PSYCH: alert & oriented x 3 with fluent speech NEURO: no focal motor/sensory deficits  LABORATORY DATA:  I have reviewed the data as listed CBC Latest Ref Rng & Units 07/03/2016 05/09/2016 05/07/2016  WBC 3.9 - 10.3 10e3/uL 9.2 8.5 11.6(H)  Hemoglobin 11.6 - 15.9 g/dL 13.4 13.3 12.9  Hematocrit 34.8 - 46.6 % 40.1 39.5 38.2  Platelets 145 - 400 10e3/uL 157 155 133(L)    CMP Latest Ref Rng & Units 07/03/2016 05/09/2016 05/07/2016  Glucose 70 - 140 mg/dl 110 96 109(H)  BUN 7.0 - 26.0 mg/dL 10.5 8 6   Creatinine 0.6 - 1.1 mg/dL 0.8 0.81 0.78  Sodium 136 - 145 mEq/L 142 134(L) 136  Potassium 3.5 - 5.1 mEq/L 3.6 4.0 3.8  Chloride 101 - 111 mmol/L - 102 104  CO2 22 - 29 mEq/L 23 21(L) 23  Calcium 8.4 - 10.4 mg/dL 10.0 9.1 9.1  Total Protein 6.4 - 8.3 g/dL 7.8 - -  Total Bilirubin 0.20 - 1.20 mg/dL 0.31 - -  Alkaline Phos  40 - 150 U/L 118 - -  AST 5 - 34 U/L 42(H) - -  ALT 0 - 55 U/L 59(H) - -    RADIOGRAPHIC STUDIES: I have personally reviewed the radiological images as listed and agreed with the findings in the report. Mr Abdomen Wwo Contrast  Result Date: 06/11/2016 CLINICAL DATA:  4.5 cm central right hepatic lesion. History of hepatitis-C. EXAM: MRI ABDOMEN WITHOUT AND WITH CONTRAST TECHNIQUE: Multiplanar multisequence MR imaging of the abdomen was performed both before and after the administration of intravenous contrast. CONTRAST:  76mL MULTIHANCE GADOBENATE DIMEGLUMINE 529 MG/ML IV SOLN COMPARISON:  CT scan 05/06/2016 FINDINGS: Lower chest:  Unremarkable Hepatobiliary: 5.4 x 4.0 x 4.1 cm irregular, heterogeneously enhancing mass is identified in the central right liver (segments VII and VIII) abutting the IVC and displacing the right hepatic vein cranially. Sagittal and coronal imaging shows filling defects in the IVC, consistent with tumor thrombus (see coronal image 28 of series 17). The inferior margin of the  lesion is contiguous with the Division of the right portal vein and there appears to be invasion into this rack to the right portal vein as well (see image 31 of series 11). Multiple small satellite lesions are identified in the posterior right liver including a 17 mm lesion in the posterior right liver (image 27 series 15). Hypervascular lesion in the dome of the liver (image 12 series 11) washes out on later postcontrast imaging (image 13 series 15). Other scattered tiny hypervascular foci are identified within the liver parenchyma. There is no evidence for gallstones, gallbladder wall thickening, or pericholecystic fluid. No intrahepatic or extrahepatic biliary dilation. Pancreas: No focal mass lesion. No dilatation of the main duct. No intraparenchymal cyst. No peripancreatic edema. Spleen: No splenomegaly. No focal mass lesion. Adrenals/Urinary Tract: 14 mm right adrenal nodule, in close proximity to the right hepatic mass. Left adrenal gland unremarkable. Tiny hypodensities in the cortex of each kidney are too small to characterize but likely represent cysts. Stomach/Bowel: Stomach is nondistended. No gastric wall thickening. No evidence of outlet obstruction. Duodenum is normally positioned as is the ligament of Treitz. No small bowel wall thickening. No small bowel dilatation. Visualized segments of the abdominal colon are unremarkable. Vascular/Lymphatic: No abdominal aortic aneurysm. Borderline lymph nodes are seen in the hepatoduodenal ligament. Other: No intraperitoneal free fluid. Musculoskeletal: No abnormal marrow enhancement within the visualized bony anatomy. IMPRESSION: 1. Large heterogeneously enhancing mass in the central right liver with invasion of right hepatic vein/IVC and right portal vein. Multiple satellite lesions are identified and other scattered hypervascular lesion showing early washout are seen elsewhere in the liver parenchyma. Together, these imaging features are highly suspicious  for hepatocellular carcinoma with metastatic or multifocal hepatic disease. 2. Small right adrenal nodule.  Metastatic involvement not excluded. Electronically Signed   By: Misty Stanley M.D.   On: 06/11/2016 11:34    ASSESSMENT & PLAN:  61 y.o. African-American female with PMH of hepatitis C, which was successfully treated to 4-5 years ago, with incidentally found to have a liver mass on CT scan.  1. Liver mass, suspicious for HCC or cholangiocarcinoma -I discussed imaging results and reviewed the images in person with the patient and her husband. -I also discussed the CT and MRI findings with radiologist Dr. Gaye Pollack -Due to the infiltrative appearance of the tumor in the right lobe, multiple satellite nodules, and significant vascular invasion with tumor thrombosis, this is highly suspicious for malignancy, especially for hepatocellular carcinoma or cholangiocarcinoma.  -She  did have hepatitis C, which was treated. No significant liver cirrhosis on the image. -We discussed the other possibility of metastatic cancer to liver, or less likely a benign lesion. Image findings are not typical for hemangioma or abscess. -I will obtain a CT chest w contrast to rule out lung primary or metastasis. She does have moderate to heavy smoking history. -I recommendation biopsy of her liver lesion to confirm diagnosis. The patient verbalized understanding and agrees to biopsy. She will be referred to IR for liver biopsy. -I'll repeat her labs today, including CBC, CMP, PT/INR, and tumor markers AFP, CA 19.9 and CEA  2. History of Hep C, treated -Patient was treated for Hepatis C about 4-5 years ago with Harvoni. She no longer follows with a liver specialist but, continues to follow with her PCP.  3. Tobacco use -She smokes less than 1/2 ppd and has been smoking about 20 years.  -She has been cutting back since she was hospitalized. -We discussed smoking cessation and will continue to address this at future  visits.   4. History of duodenal ulcer with perforation -Status post surgical repair by Dr. Redmond Pulling on general 20 12/26/2016 -She has recovered well from surgery.  Plan -She will proceed with lab draw after our visit today -She will be scheduled for liver biopsy with IR -She will be scheduled for CT chest -I will see her for follow up in 2 weeks. At this next visit we will review pathology results.   Orders Placed This Encounter  Procedures  . CT Chest W Contrast    Standing Status:   Future    Standing Expiration Date:   07/03/2017    Order Specific Question:   If indicated for the ordered procedure, I authorize the administration of contrast media per Radiology protocol    Answer:   Yes    Order Specific Question:   Reason for Exam (SYMPTOM  OR DIAGNOSIS REQUIRED)    Answer:   rule out metastasis    Order Specific Question:   Is patient pregnant?    Answer:   No    Order Specific Question:   Preferred imaging location?    Answer:   [facility]    Order Specific Question:   Radiology Contrast Protocol - do NOT remove file path    Answer:   [email or web address]  . US Biopsy    Standing Status:   Future    Standing Expiration Date:   09/03/2017    Order Specific Question:   Lab orders requested (DO NOT place separate lab orders, these will be automatically ordered during procedure specimen collection):    Answer:   Surgical Pathology    Order Specific Question:   Reason for Exam (SYMPTOM  OR DIAGNOSIS REQUIRED)    Answer:   liver mass biopsy for tissue diagnosis    Order Specific Question:   Preferred imaging location?    Answer:   [facility]  . CBC with Differential    Standing Status:   Future    Number of Occurrences:   1    Standing Expiration Date:   07/03/2017  . Comprehensive metabolic panel    Standing Status:   Future    Number of Occurrences:   1    Standing Expiration Date:   07/03/2017  . Protime-INR    Standing Status:    Future    Number of Occurrences:   1    Standing Expiration Date:   07/03/2017  .  CA 19.9    Standing Status:   Future    Number of Occurrences:   1    Standing Expiration Date:   07/03/2017  . CEA    Standing Status:   Future    Number of Occurrences:   1    Standing Expiration Date:   07/03/2017  . AFP tumor marker    Standing Status:   Future    Number of Occurrences:   1    Standing Expiration Date:   07/03/2017    All questions were answered. The patient knows to call the clinic with any problems, questions or concerns. I spent 55 minutes counseling the patient face to face. The total time spent in the appointment was 60 minutes and more than 50% was on counseling.  This document serves as a record of services personally performed by Truitt Merle, MD. It was created on her behalf by Arlyce Harman, a trained medical scribe. The creation of this record is based on the scribe's personal observations and the provider's statements to them. This document has been checked and approved by the attending provider.    Truitt Merle, MD 07/03/2016

## 2016-07-04 LAB — CEA (IN HOUSE-CHCC): CEA (CHCC-In House): 5.43 ng/mL — ABNORMAL HIGH (ref 0.00–5.00)

## 2016-07-04 LAB — AFP TUMOR MARKER: AFP, SERUM, TUMOR MARKER: 127.8 ng/mL — AB (ref 0.0–8.3)

## 2016-07-04 LAB — CANCER ANTIGEN 19-9: CAN 19-9: 61 U/mL — AB (ref 0–35)

## 2016-07-10 ENCOUNTER — Other Ambulatory Visit: Payer: Self-pay | Admitting: Hematology

## 2016-07-10 ENCOUNTER — Encounter (HOSPITAL_COMMUNITY): Payer: Self-pay

## 2016-07-10 ENCOUNTER — Ambulatory Visit (HOSPITAL_COMMUNITY)
Admission: RE | Admit: 2016-07-10 | Discharge: 2016-07-10 | Disposition: A | Discharge status: Home / Self Care | Payer: Federal, State, Local not specified - PPO | Source: Ambulatory Visit | Attending: Hematology | Admitting: Hematology

## 2016-07-10 ENCOUNTER — Encounter: Payer: Self-pay | Admitting: Hematology

## 2016-07-10 ENCOUNTER — Ambulatory Visit (HOSPITAL_BASED_OUTPATIENT_CLINIC_OR_DEPARTMENT_OTHER): Payer: Federal, State, Local not specified - PPO | Admitting: Hematology

## 2016-07-10 VITALS — BP 136/65 | HR 89 | Temp 99.1°F | Resp 18 | Ht 66.0 in | Wt 143.9 lb

## 2016-07-10 DIAGNOSIS — I2699 Other pulmonary embolism without acute cor pulmonale: Secondary | ICD-10-CM | POA: Diagnosis not present

## 2016-07-10 DIAGNOSIS — I700 Atherosclerosis of aorta: Secondary | ICD-10-CM | POA: Insufficient documentation

## 2016-07-10 DIAGNOSIS — J432 Centrilobular emphysema: Secondary | ICD-10-CM | POA: Diagnosis not present

## 2016-07-10 DIAGNOSIS — Z720 Tobacco use: Secondary | ICD-10-CM | POA: Diagnosis not present

## 2016-07-10 DIAGNOSIS — R160 Hepatomegaly, not elsewhere classified: Secondary | ICD-10-CM

## 2016-07-10 DIAGNOSIS — D3501 Benign neoplasm of right adrenal gland: Secondary | ICD-10-CM | POA: Insufficient documentation

## 2016-07-10 DIAGNOSIS — R918 Other nonspecific abnormal finding of lung field: Secondary | ICD-10-CM | POA: Insufficient documentation

## 2016-07-10 MED ORDER — RIVAROXABAN (XARELTO) VTE STARTER PACK (15 & 20 MG): ORAL_TABLET | ORAL | 0 refills | Status: DC

## 2016-07-10 MED ORDER — IOPAMIDOL (ISOVUE-300) INJECTION 61%: 100.0000 mL | Freq: Once | INTRAVENOUS | Status: DC | PRN

## 2016-07-10 MED ORDER — IOPAMIDOL (ISOVUE-300) INJECTION 61%
75.0000 mL | Freq: Once | INTRAVENOUS | Status: AC | PRN
  Administered 2016-07-10: 75 mL via INTRAVENOUS

## 2016-07-10 MED ORDER — IOPAMIDOL (ISOVUE-300) INJECTION 61%
INTRAVENOUS | Status: AC
Start: 2016-07-10 — End: 2016-07-10
  Filled 2016-07-10: qty 75

## 2016-07-10 MED FILL — XARELTO STARTER PACK: 15 & 20 | 30 days supply | Qty: 51 | Fill #0

## 2016-07-10 NOTE — Patient Instructions (Signed)
Steps to Quit Smoking Smoking tobacco can be harmful to your health and can affect almost every organ in your body. Smoking puts you, and those around you, at risk for developing many serious chronic diseases. Quitting smoking is difficult, but it is one of the best things that you can do for your health. It is never too late to quit. What are the benefits of quitting smoking? When you quit smoking, you lower your risk of developing serious diseases and conditions, such as:  Lung cancer or lung disease, such as COPD.  Heart disease.  Stroke.  Heart attack.  Infertility.  Osteoporosis and bone fractures.  Additionally, symptoms such as coughing, wheezing, and shortness of breath may get better when you quit. You may also find that you get sick less often because your body is stronger at fighting off colds and infections. If you are pregnant, quitting smoking can help to reduce your chances of having a baby of low birth weight. How do I get ready to quit? When you decide to quit smoking, create a plan to make sure that you are successful. Before you quit:  Pick a date to quit. Set a date within the next two weeks to give you time to prepare.  Write down the reasons why you are quitting. Keep this list in places where you will see it often, such as on your bathroom mirror or in your car or wallet.  Identify the people, places, things, and activities that make you want to smoke (triggers) and avoid them. Make sure to take these actions: ? Throw away all cigarettes at home, at work, and in your car. ? Throw away smoking accessories, such as ashtrays and lighters. ? Clean your car and make sure to empty the ashtray. ? Clean your home, including curtains and carpets.  Tell your family, friends, and coworkers that you are quitting. Support from your loved ones can make quitting easier.  Talk with your health care provider about your options for quitting smoking.  Find out what treatment  options are covered by your health insurance.  What strategies can I use to quit smoking? Talk with your healthcare provider about different strategies to quit smoking. Some strategies include:  Quitting smoking altogether instead of gradually lessening how much you smoke over a period of time. Research shows that quitting "cold turkey" is more successful than gradually quitting.  Attending in-person counseling to help you build problem-solving skills. You are more likely to have success in quitting if you attend several counseling sessions. Even short sessions of 10 minutes can be effective.  Finding resources and support systems that can help you to quit smoking and remain smoke-free after you quit. These resources are most helpful when you use them often. They can include: ? Online chats with a counselor. ? Telephone quitlines. ? Printed self-help materials. ? Support groups or group counseling. ? Text messaging programs. ? Mobile phone applications.  Taking medicines to help you quit smoking. (If you are pregnant or breastfeeding, talk with your health care provider first.) Some medicines contain nicotine and some do not. Both types of medicines help with cravings, but the medicines that include nicotine help to relieve withdrawal symptoms. Your health care provider may recommend: ? Nicotine patches, gum, or lozenges. ? Nicotine inhalers or sprays. ? Non-nicotine medicine that is taken by mouth.  Talk with your health care provider about combining strategies, such as taking medicines while you are also receiving in-person counseling. Using these two strategies together   makes you more likely to succeed in quitting than if you used either strategy on its own. If you are pregnant or breastfeeding, talk with your health care provider about finding counseling or other support strategies to quit smoking. Do not take medicine to help you quit smoking unless told to do so by your health care  provider. What things can I do to make it easier to quit? Quitting smoking might feel overwhelming at first, but there is a lot that you can do to make it easier. Take these important actions:  Reach out to your family and friends and ask that they support and encourage you during this time. Call telephone quitlines, reach out to support groups, or work with a counselor for support.  Ask people who smoke to avoid smoking around you.  Avoid places that trigger you to smoke, such as bars, parties, or smoke-break areas at work.  Spend time around people who do not smoke.  Lessen stress in your life, because stress can be a smoking trigger for some people. To lessen stress, try: ? Exercising regularly. ? Deep-breathing exercises. ? Yoga. ? Meditating. ? Performing a body scan. This involves closing your eyes, scanning your body from head to toe, and noticing which parts of your body are particularly tense. Purposefully relax the muscles in those areas.  Download or purchase mobile phone or tablet apps (applications) that can help you stick to your quit plan by providing reminders, tips, and encouragement. There are many free apps, such as QuitGuide from the CDC (Centers for Disease Control and Prevention). You can find other support for quitting smoking (smoking cessation) through smokefree.gov and other websites.  How will I feel when I quit smoking? Within the first 24 hours of quitting smoking, you may start to feel some withdrawal symptoms. These symptoms are usually most noticeable 2-3 days after quitting, but they usually do not last beyond 2-3 weeks. Changes or symptoms that you might experience include:  Mood swings.  Restlessness, anxiety, or irritation.  Difficulty concentrating.  Dizziness.  Strong cravings for sugary foods in addition to nicotine.  Mild weight gain.  Constipation.  Nausea.  Coughing or a sore throat.  Changes in how your medicines work in your  body.  A depressed mood.  Difficulty sleeping (insomnia).  After the first 2-3 weeks of quitting, you may start to notice more positive results, such as:  Improved sense of smell and taste.  Decreased coughing and sore throat.  Slower heart rate.  Lower blood pressure.  Clearer skin.  The ability to breathe more easily.  Fewer sick days.  Quitting smoking is very challenging for most people. Do not get discouraged if you are not successful the first time. Some people need to make many attempts to quit before they achieve long-term success. Do your best to stick to your quit plan, and talk with your health care provider if you have any questions or concerns. This information is not intended to replace advice given to you by your health care provider. Make sure you discuss any questions you have with your health care provider. Document Released: 03/19/2001 Document Revised: 11/21/2015 Document Reviewed: 08/09/2014 Elsevier Interactive Patient Education  2017 [employer]  

## 2016-07-10 NOTE — Progress Notes (Signed)
[facility]  [phone] [phone]  Clinic Follow Up Note   Patient Care Team: Thurman Coyer, MD as PCP - General (Internal Medicine) Greer Pickerel, MD as Consulting Physician (General Surgery) Truitt Merle, MD as Consulting Physician (Hematology) 07/10/2016   CHIEF COMPLAINTS:  PE   HISTORY OF PRESENTING ILLNESS (07/03/2016):  Kristen Everett 61 y.o. female  originally presented to the ED on 05/06/2016 complaining of acute onset epigastric pain with vomiting. CT abdomen pelvis at that time showed a 4.5 cm heterogeneously enhancing lesion in the central right hepatic lobe. This is suspicious for hepatic neoplasm, with abscess considered less likely. There was also a moderate amount of free intraperitoneal air, which appears to be due to perforated ulcer involving the duodenal bulb. Duodenal ulcer was repaired with a patch on 05/06/2016.  Abdominal MRI on 06/11/2016 showed a large heterogeneously enhancing mass in the central right liver with invasion of right hepatic vein/IVC and right portal vein. There were multiple satellite lesions identified and other scattered hypervascular lesion showing early washout are seen elsewhere in the liver parenchyma. Also seen was a small right adrenal nodule.   She presents for consultation today with her husband. She is doing well overall. She originally presented with acute epigastric pain with abdominal cramping and sweating, then while in the ED she began to experience nausea. She has been told she had gastritis in the past. She denies any black stool or bleeding. She denies abdominal pain prior to 05/06/2016. She had not been eating well but has been gaining weight back. She reports fatigue. She denies fever or night sweats.  She was diagnosed with Hepatitis C many years ago and was treated with Harvoni about 4-5 years ago. She no longer follows with liver specialist but, continues to follow with her PCP. She does not know how she  contracted Hepatis C. She never used intravenous drugs or received blood. She was told she had a fatty liver while being treated for Hep C.   She has high blood pressure and takes Lipitor for cholesterol. Her mother had breast cancer and was diagnosed at age 63. She smokes less than 1/2 ppd and has been smoking about 20 years. She has been cutting back since she was hospitalized. States she has a heart murmur.   CURRENT THERAPY: Pending biopsy. Xarelto starter pack 07/10/16.  INTERVAL HISTORY: Kristen Everett presents today due to CT of chest performed today which incidentally found pulmonary embolism in the left lower lobe segmental and subsegmental branches. The patient denies SOB or chest pain, her pulse ox was 100% on room air. No leg pain or swelling.   MEDICAL HISTORY:  Past Medical History:  Diagnosis Date  . Anxiety   . Family history of adverse reaction to anesthesia    " MY SISTER    WOKE UP "  . Hepatitis    HEPATITIS C    TREATED WITH HARVONI   . Hypertension   . Ulcer ([place]) 05/06/2016   PERFORATED DUODENAL ULCER (    SURGICAL HISTORY: Past Surgical History:  Procedure Laterality Date  . LAPAROSCOPY N/A 05/06/2016   Procedure: DIAGNOSTIC LAPAROSCOPY;  Surgeon: Greer Pickerel, MD;  Location: [facility];  Service: General;  Laterality: N/A;  . LAPAROTOMY N/A 05/06/2016   Procedure: EXPLORATORY LAPAROTOMY;  Surgeon: Greer Pickerel, MD;  Location: [facility];  Service: General;  Laterality: N/A;  . REPAIR OF PERFORATED ULCER N/A 05/06/2016   Procedure: REPAIR OF PERFORATED DUODENAL ULCER;  Surgeon:  Greer Pickerel, MD;  Location: [facility];  Service: General;  Laterality: N/A;    SOCIAL HISTORY: Social History   Social History  . Marital status: Married    Spouse name: N/A  . Number of children: N/A  . Years of education: N/A   Occupational History  . Not on file.   Social History Main Topics  . Smoking status: Current Every Day Smoker    Packs/day: 0.50    Years: 26.00    Types: Cigarettes    . Smokeless tobacco: Never Used  . Alcohol use No  . Drug use: No  . Sexual activity: Not on file   Other Topics Concern  . Not on file   Social History Narrative  . No narrative on file    FAMILY HISTORY: Family History  Problem Relation Age of Onset  . Cancer Mother 37    breast cancer    ALLERGIES:  has No Known Allergies.  MEDICATIONS:  Current Outpatient Prescriptions  Medication Sig Dispense Refill  . amLODipine (NORVASC) 10 MG tablet Take 10 mg by mouth daily.    Marland Kitchen atenolol (TENORMIN) 25 MG tablet Take 25 mg by mouth daily.     Marland Kitchen atorvastatin (LIPITOR) 20 MG tablet Take 20 mg by mouth daily.     . Rivaroxaban (XARELTO STARTER PACK) 15 & 20 MG TBPK Take as directed on package: Start with one 15mg  tablet by mouth twice a day with food. On Day 22, switch to one 20mg  tablet once a day with food. (Patient not taking: Reported on 07/10/2016) 51 each 0   No current facility-administered medications for this visit.     REVIEW OF SYSTEMS:   Constitutional: Denies fevers, chills or abnormal night sweats (+) mild fatigue Eyes: Denies blurriness of vision, double vision or watery eyes Ears, nose, mouth, throat, and face: Denies mucositis or sore throat Respiratory: Denies cough, dyspnea or wheezes Cardiovascular: Denies palpitation, chest discomfort or lower extremity swelling Gastrointestinal:  Denies nausea, heartburn or change in bowel habits Skin: Denies abnormal skin rashes Lymphatics: Denies new lymphadenopathy or easy bruising Neurological:Denies numbness, tingling or new weaknesses Behavioral/Psych: Mood is stable, no new changes  All other systems were reviewed with the patient and are negative.  PHYSICAL EXAMINATION: ECOG PERFORMANCE STATUS: 0 - Asymptomatic  Vitals:   07/10/16 1501  BP: 136/65  Pulse: 89  Resp: 18  Temp: 99.1 F (37.3 C)   Filed Weights   07/10/16 1501  Weight: 143 lb 14.4 oz (65.3 kg)    GENERAL:alert, no distress and  comfortable SKIN: skin color, texture, turgor are normal, no rashes or significant lesions EYES: normal, conjunctiva are pink and non-injected, sclera clear OROPHARYNX:no exudate, no erythema and lips, buccal mucosa, and tongue normal  NECK: supple, thyroid normal size, non-tender, without nodularity LYMPH:  no palpable lymphadenopathy in the cervical, axillary or inguinal LUNGS: clear to auscultation and percussion with normal breathing effort HEART: regular rate & rhythm and no lower extremity edema (+) murmur heard ABDOMEN:abdomen soft, non-tender and normal bowel sounds. No hepatomegaly appreciated. (+) well healed midline Surgical scar with mild tenderness. Musculoskeletal:no cyanosis of digits and no clubbing  PSYCH: alert & oriented x 3 with fluent speech NEURO: no focal motor/sensory deficits  LABORATORY DATA:  I have reviewed the data as listed CBC Latest Ref Rng & Units 07/03/2016 05/09/2016 05/07/2016  WBC 3.9 - 10.3 10e3/uL 9.2 8.5 11.6(H)  Hemoglobin 11.6 - 15.9 g/dL 13.4 13.3 12.9  Hematocrit 34.8 - 46.6 % 40.1 39.5  38.2  Platelets 145 - 400 10e3/uL 157 155 133(L)    CMP Latest Ref Rng & Units 07/03/2016 05/09/2016 05/07/2016  Glucose 70 - 140 mg/dl 110 96 109(H)  BUN 7.0 - 26.0 mg/dL 10.5 8 6   Creatinine 0.6 - 1.1 mg/dL 0.8 0.81 0.78  Sodium 136 - 145 mEq/L 142 134(L) 136  Potassium 3.5 - 5.1 mEq/L 3.6 4.0 3.8  Chloride 101 - 111 mmol/L - 102 104  CO2 22 - 29 mEq/L 23 21(L) 23  Calcium 8.4 - 10.4 mg/dL 10.0 9.1 9.1  Total Protein 6.4 - 8.3 g/dL 7.8 - -  Total Bilirubin 0.20 - 1.20 mg/dL 0.31 - -  Alkaline Phos 40 - 150 U/L 118 - -  AST 5 - 34 U/L 42(H) - -  ALT 0 - 55 U/L 59(H) - -    Results for Kristen Everett, Kristen Everett (MRN [id number]) as of 07/10/2016 16:35  Ref. Range 07/03/2016 15:42 07/03/2016 15:42  AFP, Serum, Tumor Marker Latest Ref Range: 0.0 - 8.3 ng/mL  127.8 (H)  CA 19-9 Latest Ref Range: 0 - 35 U/mL  61 (H)  CEA ([facility]) Latest Ref Range: 0.00 - 5.00 ng/mL 5.43  (H)     RADIOGRAPHIC STUDIES: I have personally reviewed the radiological images as listed and agreed with the findings in the report. Ct Chest W Contrast  Result Date: 07/10/2016 CLINICAL DATA:  Findings suspicious for multifocal hepatocellular carcinoma in the liver on recent MRI abdomen study. Patient presents for chest staging. EXAM: CT CHEST WITH CONTRAST TECHNIQUE: Multidetector CT imaging of the chest was performed during intravenous contrast administration. CONTRAST:  67mL ISOVUE-300 IOPAMIDOL (ISOVUE-300) INJECTION 61% COMPARISON:  06/11/2016 MRI abdomen and 05/06/2016 CT abdomen/pelvis. FINDINGS: Cardiovascular: Normal heart size. No significant pericardial fluid/thickening. Atherosclerotic nonaneurysmal abdominal aorta. Normal caliber pulmonary arteries. Acute pulmonary emboli are present within segmental and subsegmental left lower lobe pulmonary artery branches (series 2/ images 91 - 101). Mediastinum/Nodes: No discrete thyroid nodules. Fluid level in the mid to lower thoracic esophagus suggesting esophageal dysmotility and/ or gastroesophageal reflux. No pathologically enlarged axillary, mediastinal or hilar lymph nodes. Lungs/Pleura: No pneumothorax. No pleural effusion. Mild-to-moderate centrilobular emphysema. No acute consolidative airspace disease or lung masses. There a few scattered tiny pulmonary nodules in both lungs measuring up to the 4 mm in the left upper lobe (series 5/ image 29). Upper abdomen: Re- demonstration of known infiltrative mass in the central right liver lobe with increasing IVC invasion (series 2/ image 131) compared to the 06/12/2015 MRI. Right adrenal 1.1 cm nodule is stable since 05/06/2016 and demonstrates signal loss on out of phase chemical shift imaging on the 06/11/2016 MRI, compatible with an adenoma. Small gastric diverticulum is noted between the gastric fundus and left adrenal gland. Musculoskeletal: No aggressive appearing focal osseous lesions. Mild  thoracic spondylosis. IMPRESSION: 1. Acute small burden pulmonary embolism in the left lower lobe segmental and subsegmental branches. These may represent tumor thrombi. 2. Re- demonstration of known infiltrative liver mass in the central right liver lobe with increasing IVC invasion compared to the 06/12/2015 MRI. 3. A few scattered tiny pulmonary nodules, largest 4 mm, indeterminate. Recommend attention on follow-up chest CT in 3-6 months. 4. Aortic atherosclerosis. 5. Mild-to-moderate centrilobular emphysema. 6. Right adrenal adenoma. Critical Value/emergent results were called by telephone at the time of interpretation on 07/10/2016 at 10:50 am to Dr. Truitt Merle , who verbally acknowledged these results. Electronically Signed   By: Ilona Sorrel M.D.   On: 07/10/2016 11:01  Mr Abdomen Wwo Contrast  Result Date: 06/11/2016 CLINICAL DATA:  4.5 cm central right hepatic lesion. History of hepatitis-C. EXAM: MRI ABDOMEN WITHOUT AND WITH CONTRAST TECHNIQUE: Multiplanar multisequence MR imaging of the abdomen was performed both before and after the administration of intravenous contrast. CONTRAST:  8mL MULTIHANCE GADOBENATE DIMEGLUMINE 529 MG/ML IV SOLN COMPARISON:  CT scan 05/06/2016 FINDINGS: Lower chest:  Unremarkable Hepatobiliary: 5.4 x 4.0 x 4.1 cm irregular, heterogeneously enhancing mass is identified in the central right liver (segments VII and VIII) abutting the IVC and displacing the right hepatic vein cranially. Sagittal and coronal imaging shows filling defects in the IVC, consistent with tumor thrombus (see coronal image 28 of series 17). The inferior margin of the lesion is contiguous with the Division of the right portal vein and there appears to be invasion into this rack to the right portal vein as well (see image 31 of series 11). Multiple small satellite lesions are identified in the posterior right liver including a 17 mm lesion in the posterior right liver (image 27 series 15). Hypervascular  lesion in the dome of the liver (image 12 series 11) washes out on later postcontrast imaging (image 13 series 15). Other scattered tiny hypervascular foci are identified within the liver parenchyma. There is no evidence for gallstones, gallbladder wall thickening, or pericholecystic fluid. No intrahepatic or extrahepatic biliary dilation. Pancreas: No focal mass lesion. No dilatation of the main duct. No intraparenchymal cyst. No peripancreatic edema. Spleen: No splenomegaly. No focal mass lesion. Adrenals/Urinary Tract: 14 mm right adrenal nodule, in close proximity to the right hepatic mass. Left adrenal gland unremarkable. Tiny hypodensities in the cortex of each kidney are too small to characterize but likely represent cysts. Stomach/Bowel: Stomach is nondistended. No gastric wall thickening. No evidence of outlet obstruction. Duodenum is normally positioned as is the ligament of Treitz. No small bowel wall thickening. No small bowel dilatation. Visualized segments of the abdominal colon are unremarkable. Vascular/Lymphatic: No abdominal aortic aneurysm. Borderline lymph nodes are seen in the hepatoduodenal ligament. Other: No intraperitoneal free fluid. Musculoskeletal: No abnormal marrow enhancement within the visualized bony anatomy. IMPRESSION: 1. Large heterogeneously enhancing mass in the central right liver with invasion of right hepatic vein/IVC and right portal vein. Multiple satellite lesions are identified and other scattered hypervascular lesion showing early washout are seen elsewhere in the liver parenchyma. Together, these imaging features are highly suspicious for hepatocellular carcinoma with metastatic or multifocal hepatic disease. 2. Small right adrenal nodule.  Metastatic involvement not excluded. Electronically Signed   By: Misty Stanley M.D.   On: 06/11/2016 11:34    ASSESSMENT & PLAN:  61 y.o. African-American female with PMH of hepatitis C, which was successfully treated to 4-5  years ago, with incidentally found to have a liver mass on CT scan.  1. LLL pulmonary embolism  -This was found on her staging CT chest. She is asymptomatic  -She has no leg swelling or any sign of DVT. The PET is possible from her tumor thrombosis in the liver.  -We discussed right options off anticoagulation, including Lovenox injection, Coumadin and the role told. The logistics, benefits and potential side effects were discussed with patien and her husband. After lengthy discussion, patient opted Xarelto.  -I called in Xarelto starting package (15mg  bid for 21 days then 20mg  daily). She will hold Xarelto the day before and the day of her liver biopsy on 07/15/16.  2.  Liver mass, suspicious for [facility] or cholangiocarcinoma -I discussed imaging results and  reviewed the images in person with the patient and her husband. -I also discussed the CT and MRI findings with radiologist Dr. Gaye Pollack -Due to the infiltrative appearance of the tumor in the right lobe, multiple satellite nodules, and significant vascular invasion with tumor thrombosis, this is highly suspicious for malignancy, especially for hepatocellular carcinoma or cholangiocarcinoma.  -She did have hepatitis C, which was treated. No significant liver cirrhosis on the image. -We discussed the other possibility of metastatic cancer to liver, or less likely a benign lesion. Image findings are not typical for hemangioma or abscess. -I will obtain a CT chest w contrast to rule out lung primary or metastasis. She does have moderate to heavy smoking history. -I recommended biopsy of her liver lesion to confirm diagnosis. The patient verbalized understanding and agrees to biopsy. She will be referred to IR for liver biopsy. -Tumor markers AFP, CA 19.9 and CEA on 07/03/16 were elevated. -CT scan of the chest on 07/10/16 showed acute small burden pulmonary embolism in the left lower lobe segmental and subsegmental branches that may represent tumor thrombi,  re- demonstration of known infiltrative liver mass in the central right liver lobe with increasing IVC invasion compared to the 06/12/2015 MRI, and a few scattered tiny pulmonary nodules.   3. History of Hep C, treated -Patient was treated for Hepatis C about 4-5 years ago with Harvoni. She no longer follows with a liver specialist but, continues to follow with her PCP.  4. Tobacco use -She smokes less than 1/2 ppd and has been smoking about 20 years.  -She has been cutting back since she was hospitalized. -We discussed smoking cessation and will continue to address this at future visits.   5. History of duodenal ulcer with perforation -Status post surgical repair by Dr. Redmond Pulling on general 20 12/26/2016 -She has recovered well from surgery.    Plan -she will start Xarelto today, at a loading dose of 15 mg twice daily for 21 days, then 20 mg daily afterwards  -She is be scheduled for liver biopsy with IR on 4/9, hold Xarelto on April 8 and April 9, resume on 4/10 -I will see her for follow up on 07/17/16 and we will review pathology results.   No orders of the defined types were placed in this encounter.   All questions were answered. The patient knows to call the clinic with any problems, questions or concerns. I spent 15 minutes counseling the patient face to face. The total time spent in the appointment was 20 minutes and more than 50% was on counseling.    Truitt Merle, MD 07/10/2016   This document serves as a record of services personally performed by Truitt Merle, MD. It was created on her behalf by Darcus Austin, a trained medical scribe. The creation of this record is based on the scribe's personal observations and the provider's statements to them. This document has been checked and approved by the attending provider.

## 2016-07-11 ENCOUNTER — Telehealth: Payer: Self-pay | Admitting: *Deleted

## 2016-07-11 NOTE — Telephone Encounter (Signed)
Was informed by Kristen Everett in IR re:  Kristen Everett just needs to HOLD  Xarelto  24 hours prior to biopsy.  Spoke with Kristen Everett and informed Kristen Everett of same info -  Dr. Burr Medico had already instructed Kristen Everett to Hold Xarelto on Sunday 07/14/16 before biopsy on 07/15/16 at office visit yesterday.   Kristen Everett voiced understanding.

## 2016-07-12 ENCOUNTER — Ambulatory Visit: Payer: Federal, State, Local not specified - PPO | Admitting: Hematology

## 2016-07-12 ENCOUNTER — Other Ambulatory Visit: Payer: Self-pay | Admitting: Radiology

## 2016-07-13 ENCOUNTER — Encounter: Payer: Self-pay | Admitting: Hematology

## 2016-07-13 DIAGNOSIS — I2699 Other pulmonary embolism without acute cor pulmonale: Secondary | ICD-10-CM | POA: Insufficient documentation

## 2016-07-15 ENCOUNTER — Ambulatory Visit (HOSPITAL_COMMUNITY)
Admission: RE | Admit: 2016-07-15 | Discharge: 2016-07-15 | Disposition: A | Discharge status: Home / Self Care | Payer: Federal, State, Local not specified - PPO | Source: Ambulatory Visit | Attending: Hematology | Admitting: Hematology

## 2016-07-15 ENCOUNTER — Encounter (HOSPITAL_COMMUNITY): Payer: Self-pay

## 2016-07-15 DIAGNOSIS — I10 Essential (primary) hypertension: Secondary | ICD-10-CM | POA: Diagnosis not present

## 2016-07-15 DIAGNOSIS — B1920 Unspecified viral hepatitis C without hepatic coma: Secondary | ICD-10-CM | POA: Diagnosis not present

## 2016-07-15 DIAGNOSIS — C220 Liver cell carcinoma: Secondary | ICD-10-CM | POA: Insufficient documentation

## 2016-07-15 DIAGNOSIS — F17210 Nicotine dependence, cigarettes, uncomplicated: Secondary | ICD-10-CM | POA: Diagnosis not present

## 2016-07-15 DIAGNOSIS — Z7901 Long term (current) use of anticoagulants: Secondary | ICD-10-CM | POA: Insufficient documentation

## 2016-07-15 DIAGNOSIS — R160 Hepatomegaly, not elsewhere classified: Secondary | ICD-10-CM

## 2016-07-15 DIAGNOSIS — F419 Anxiety disorder, unspecified: Secondary | ICD-10-CM | POA: Insufficient documentation

## 2016-07-15 DIAGNOSIS — Z79899 Other long term (current) drug therapy: Secondary | ICD-10-CM | POA: Diagnosis not present

## 2016-07-15 HISTORY — DX: Disease of blood and blood-forming organs, unspecified: D75.9

## 2016-07-15 LAB — CBC
HCT: 40.4 % (ref 36.0–46.0)
Hemoglobin: 13.4 g/dL (ref 12.0–15.0)
MCH: 29.3 pg (ref 26.0–34.0)
MCHC: 33.2 g/dL (ref 30.0–36.0)
MCV: 88.4 fL (ref 78.0–100.0)
PLATELETS: 195 10*3/uL (ref 150–400)
RBC: 4.57 MIL/uL (ref 3.87–5.11)
RDW: 12.8 % (ref 11.5–15.5)
WBC: 10.4 10*3/uL (ref 4.0–10.5)

## 2016-07-15 LAB — APTT: APTT: 42 s — AB (ref 24–36)

## 2016-07-15 LAB — PROTIME-INR
INR: 1.04
PROTHROMBIN TIME: 13.6 s (ref 11.4–15.2)

## 2016-07-15 MED ORDER — GELATIN ABSORBABLE 12-7 MM EX MISC
CUTANEOUS | Status: AC
  Filled 2016-07-15: qty 1

## 2016-07-15 MED ORDER — FENTANYL CITRATE (PF) 100 MCG/2ML IJ SOLN
INTRAMUSCULAR | Status: AC | PRN
  Administered 2016-07-15 (×2): 50 ug via INTRAVENOUS

## 2016-07-15 MED ORDER — MIDAZOLAM HCL 2 MG/2ML IJ SOLN
INTRAMUSCULAR | Status: AC
Start: 2016-07-15 — End: 2016-07-15
  Filled 2016-07-15: qty 4

## 2016-07-15 MED ORDER — SODIUM CHLORIDE 0.9 % IV SOLN
INTRAVENOUS | Status: AC | PRN
  Administered 2016-07-15: 75 mL/h via INTRAVENOUS

## 2016-07-15 MED ORDER — LIDOCAINE HCL 1 % IJ SOLN
INTRAMUSCULAR | Status: AC
  Filled 2016-07-15: qty 20

## 2016-07-15 MED ORDER — FENTANYL CITRATE (PF) 100 MCG/2ML IJ SOLN
INTRAMUSCULAR | Status: AC
  Filled 2016-07-15: qty 4

## 2016-07-15 MED ORDER — MIDAZOLAM HCL 2 MG/2ML IJ SOLN
INTRAMUSCULAR | Status: AC | PRN
  Administered 2016-07-15 (×2): 1 mg via INTRAVENOUS

## 2016-07-15 MED ORDER — SODIUM CHLORIDE 0.9 % IV SOLN
INTRAVENOUS | Status: DC
Start: 2016-07-15 — End: 2016-07-16

## 2016-07-15 NOTE — Discharge Instructions (Signed)
°  RESTART XARELTO TOMORROW- 4/10 TUE  Liver Biopsy, Care After These instructions give you information on caring for yourself after your procedure. Your doctor may also give you more specific instructions. Call your doctor if you have any problems or questions after your procedure. Follow these instructions at home:  Rest at home for 1-2 days or as told by your doctor.  Have someone stay with you for at least 24 hours.  Do not do these things in the first 24 hours:  Drive.  Use machinery.  Take care of other people.  Sign legal documents.  Take a bath or shower.  There are many different ways to close and cover a cut (incision). For example, a cut can be closed with stitches, skin glue, or adhesive strips. Follow your doctor's instructions on:  Taking care of your cut.  Changing and removing your bandage (dressing).  Removing whatever was used to close your cut.  Do not drink alcohol in the first week.  Do not lift more than 5 pounds or play contact sports for the first 2 weeks.  Take medicines only as told by your doctor. For 1 week, do not take medicine that has aspirin in it or medicines like ibuprofen.  Get your test results. Contact a doctor if:  A cut bleeds and leaves more than just a small spot of blood.  A cut is red, puffs up (swells), or hurts more than before.  Fluid or something else comes from a cut.  A cut smells bad.  You have a fever or chills. Get help right away if:  You have swelling, bloating, or pain in your belly (abdomen).  You get dizzy or faint.  You have a rash.  You feel sick to your stomach (nauseous) or throw up (vomit).  You have trouble breathing, feel short of breath, or feel faint.  Your chest hurts.  You have problems talking or seeing.  You have trouble balancing or moving your arms or legs. This information is not intended to replace advice given to you by your health care provider. Make sure you discuss any  questions you have with your health care provider. Document Released: 01/02/2008 Document Revised: 08/31/2015 Document Reviewed: 05/21/2013 Elsevier Interactive Patient Education  2017 [employer].

## 2016-07-15 NOTE — Sedation Documentation (Signed)
Patient is resting comfortably. 

## 2016-07-15 NOTE — Procedures (Signed)
Central Liver lesion Bx 18 g core times 3 EBL 0  COMP 0

## 2016-07-15 NOTE — Sedation Documentation (Signed)
O2 d/c'd 

## 2016-07-15 NOTE — H&P (Signed)
Chief Complaint: Patient was seen in consultation today for liver lesion biopsy at the request of Feng,Yan  Referring Physician(s): Feng,Yan  Supervising Physician: Marybelle Killings  Patient Status: [facility] - Out-pt  History of Present Illness: Kristen Everett is a 61 y.o. female   Pt presented with N/V; abd pain 04/2016 Perforated duodenal ulcer -- surgery performed Noted findings of hepatic lesion Referred to Dr Burr Medico MR 06/11/2016: IMPRESSION: 1. Large heterogeneously enhancing mass in the central right liver with invasion of right hepatic vein/IVC and right portal vein. Multiple satellite lesions are identified and other scattered hypervascular lesion showing early washout are seen elsewhere in the liver parenchyma. Together, these imaging features are highly suspicious for hepatocellular carcinoma with metastatic or multifocal hepatic disease. 2. Small right adrenal nodule.  Metastatic involvement not excluded  Now scheduled for tissue diagnosis biopsy  Last dose Xarelto Sat 4/7  Past Medical History:  Diagnosis Date  . Anxiety   . Blood dyscrasia   . Family history of adverse reaction to anesthesia    " MY SISTER    WOKE UP "  . Hepatitis    HEPATITIS C    TREATED WITH HARVONI   . Hypertension   . Ulcer ([place]) 05/06/2016   PERFORATED DUODENAL ULCER (    Past Surgical History:  Procedure Laterality Date  . LAPAROSCOPY N/A 05/06/2016   Procedure: DIAGNOSTIC LAPAROSCOPY;  Surgeon: Greer Pickerel, MD;  Location: [place];  Service: General;  Laterality: N/A;  . LAPAROTOMY N/A 05/06/2016   Procedure: EXPLORATORY LAPAROTOMY;  Surgeon: Greer Pickerel, MD;  Location: [facility];  Service: General;  Laterality: N/A;  . REPAIR OF PERFORATED ULCER N/A 05/06/2016   Procedure: REPAIR OF PERFORATED DUODENAL ULCER;  Surgeon: Greer Pickerel, MD;  Location: [facility];  Service: General;  Laterality: N/A;    Allergies: Patient has no known allergies.  Medications: Prior to Admission medications     Medication Sig Start Date End Date Taking? Authorizing Provider  amLODipine (NORVASC) 10 MG tablet Take 10 mg by mouth daily. 04/02/16  Yes Historical Provider, MD  atenolol (TENORMIN) 25 MG tablet Take 25 mg by mouth daily.  03/08/16  Yes Historical Provider, MD  atorvastatin (LIPITOR) 20 MG tablet Take 20 mg by mouth daily.  04/06/16  Yes Historical Provider, MD  Rivaroxaban (XARELTO STARTER PACK) 15 & 20 MG TBPK Take as directed on package: Start with one 15mg  tablet by mouth twice a day with food. On Day 22, switch to one 20mg  tablet once a day with food. 07/10/16  Yes Truitt Merle, MD     Family History  Problem Relation Age of Onset  . Cancer Mother 75    breast cancer    Social History   Social History  . Marital status: Married    Spouse name: N/A  . Number of children: N/A  . Years of education: N/A   Social History Main Topics  . Smoking status: Current Every Day Smoker    Packs/day: 0.50    Years: 26.00    Types: Cigarettes  . Smokeless tobacco: Never Used  . Alcohol use No  . Drug use: No  . Sexual activity: Not Asked   Other Topics Concern  . None   Social History Narrative  . None    Review of Systems: A 12 point ROS discussed and pertinent positives are indicated in the HPI above.  All other systems are negative.  Review of Systems  Constitutional: Positive for appetite change. Negative for  activity change, fatigue and fever.  Respiratory: Negative for shortness of breath.   Cardiovascular: Negative for chest pain.  Gastrointestinal: Positive for abdominal pain.  Psychiatric/Behavioral: Negative for behavioral problems and confusion.    Vital Signs: BP (!) 144/83   Pulse 76   Temp 98.4 F (36.9 C)   Ht 5\' 7"  (1.702 m)   Wt 143 lb (64.9 kg)   SpO2 98%   BMI 22.40 kg/m   Physical Exam  Constitutional: She is oriented to person, place, and time.  Cardiovascular: Normal rate, regular rhythm and normal heart sounds.   Pulmonary/Chest: Effort normal  and breath sounds normal.  Abdominal: Soft. Bowel sounds are normal. There is no tenderness.  Musculoskeletal: Normal range of motion.  Neurological: She is alert and oriented to person, place, and time.  Skin: Skin is warm and dry.  Psychiatric: She has a normal mood and affect. Her behavior is normal. Judgment and thought content normal.  Nursing note and vitals reviewed.   Mallampati Score:  MD Evaluation Airway: WNL Heart: WNL Abdomen: WNL Chest/ Lungs: WNL ASA  Classification: 3 Mallampati/Airway Score: Two  Imaging: Ct Chest W Contrast  Result Date: 07/10/2016 CLINICAL DATA:  Findings suspicious for multifocal hepatocellular carcinoma in the liver on recent MRI abdomen study. Patient presents for chest staging. EXAM: CT CHEST WITH CONTRAST TECHNIQUE: Multidetector CT imaging of the chest was performed during intravenous contrast administration. CONTRAST:  20mL ISOVUE-300 IOPAMIDOL (ISOVUE-300) INJECTION 61% COMPARISON:  06/11/2016 MRI abdomen and 05/06/2016 CT abdomen/pelvis. FINDINGS: Cardiovascular: Normal heart size. No significant pericardial fluid/thickening. Atherosclerotic nonaneurysmal abdominal aorta. Normal caliber pulmonary arteries. Acute pulmonary emboli are present within segmental and subsegmental left lower lobe pulmonary artery branches (series 2/ images 91 - 101). Mediastinum/Nodes: No discrete thyroid nodules. Fluid level in the mid to lower thoracic esophagus suggesting esophageal dysmotility and/ or gastroesophageal reflux. No pathologically enlarged axillary, mediastinal or hilar lymph nodes. Lungs/Pleura: No pneumothorax. No pleural effusion. Mild-to-moderate centrilobular emphysema. No acute consolidative airspace disease or lung masses. There a few scattered tiny pulmonary nodules in both lungs measuring up to the 4 mm in the left upper lobe (series 5/ image 29). Upper abdomen: Re- demonstration of known infiltrative mass in the central right liver lobe with  increasing IVC invasion (series 2/ image 131) compared to the 06/12/2015 MRI. Right adrenal 1.1 cm nodule is stable since 05/06/2016 and demonstrates signal loss on out of phase chemical shift imaging on the 06/11/2016 MRI, compatible with an adenoma. Small gastric diverticulum is noted between the gastric fundus and left adrenal gland. Musculoskeletal: No aggressive appearing focal osseous lesions. Mild thoracic spondylosis. IMPRESSION: 1. Acute small burden pulmonary embolism in the left lower lobe segmental and subsegmental branches. These may represent tumor thrombi. 2. Re- demonstration of known infiltrative liver mass in the central right liver lobe with increasing IVC invasion compared to the 06/12/2015 MRI. 3. A few scattered tiny pulmonary nodules, largest 4 mm, indeterminate. Recommend attention on follow-up chest CT in 3-6 months. 4. Aortic atherosclerosis. 5. Mild-to-moderate centrilobular emphysema. 6. Right adrenal adenoma. Critical Value/emergent results were called by telephone at the time of interpretation on 07/10/2016 at 10:50 am to Dr. Truitt Merle , who verbally acknowledged these results. Electronically Signed   By: Ilona Sorrel M.D.   On: 07/10/2016 11:01    Labs:  CBC:  Recent Labs  05/06/16 0745 05/07/16 1040 05/09/16 0450 07/03/16 1542  WBC 11.7* 11.6* 8.5 9.2  HGB 13.5 12.9 13.3 13.4  HCT 40.6 38.2 39.5 40.1  PLT 164 133* 155 157    COAGS:  Recent Labs  05/06/16 1219 07/03/16 1542  INR 1.01 1.00*  APTT 33  --     BMP:  Recent Labs  05/06/16 0745 05/07/16 1040 05/09/16 0450 07/03/16 1542  NA 141 136 134* 142  K 3.2* 3.8 4.0 3.6  CL 106 104 102  --   CO2 26 23 21* 23  GLUCOSE 205* 109* 96 110  BUN 11 6 8  10.5  CALCIUM 9.5 9.1 9.1 10.0  CREATININE 0.85 0.78 0.81 0.8  GFRNONAA >60 >60 >60  --   GFRAA >60 >60 >60  --     LIVER FUNCTION TESTS:  Recent Labs  05/06/16 0745 07/03/16 1542  BILITOT 0.5 0.31  AST 18 42*  ALT 17 59*  ALKPHOS 96 118    PROT 7.3 7.8  ALBUMIN 4.2 4.3    TUMOR MARKERS: No results for input(s): AFPTM, CEA, CA199, CHROMGRNA in the last 8760 hours.  Assessment and Plan:  Liver lesion; invasion Rt hepatic vein and IVC Hx Hep C LD Xarelto 4/7 Now scheduled for biopsy of liver lesion per Dr Burr Medico Risks and Benefits discussed with the patient including, but not limited to bleeding, infection, damage to adjacent structures or low yield requiring additional tests. All of the patient's questions were answered, patient is agreeable to proceed. Consent signed and in chart.  Thank you for this interesting consult.  I greatly enjoyed meeting ABI SHOULTS and look forward to participating in their care.  A copy of this report was sent to the requesting provider on this date.  Electronically Signed: Kj Imbert A 07/15/2016, 8:54 AM   I spent a total of  30 Minutes   in face to face in clinical consultation, greater than 50% of which was counseling/coordinating care for liver lesion bx

## 2016-07-15 NOTE — Sedation Documentation (Signed)
Gelfoam inserted by Dr Barbie Banner

## 2016-07-15 NOTE — Sedation Documentation (Addendum)
Moved to [place] suite

## 2016-07-16 NOTE — Progress Notes (Signed)
[place]  Telephone:([phone] Fax:([phone]) [phone]  Clinic Follow Up Note   Patient Care Team: Thurman Coyer, MD as PCP - General (Internal Medicine) Greer Pickerel, MD as Consulting Physician (General Surgery) Truitt Merle, MD as Consulting Physician (Hematology) 07/17/2016   CHIEF COMPLAINTS:  [facility]   Oncology History   Cancer Staging Hepatocellular carcinoma [facility]) Staging form: Liver, AJCC 8th Edition - Clinical stage from 07/15/2016: Stage IIIB (cT4, cN0, cM0) - Signed by Truitt Merle, MD on 07/17/2016       Pulmonary embolism ([place])   07/10/2016 Imaging    CT chest w/ contrast 07/10/16 IMPRESSION: Acute small burden pulmonary embolism in the left lower lobe segmental and subsegmental branches. These may represent tumor thrombi.      07/10/2016 Miscellaneous    Xarelto starter pack given on 07/10/16. To continue      07/13/2016 Initial Diagnosis    Pulmonary embolism (HCC)      Hepatocellular carcinoma ([place])   05/06/2016 Imaging    CT AP w Contrast 05/06/16 IMPRESSION: 4.5 cm heterogeneously enhancing lesion in the central right hepatic lobe. This is suspicious for hepatic neoplasm, with abscess considered less likely. Bilateral lower lobe atelectasis.      06/11/2016 Imaging    MRI abdomen w w/o contrast 06/11/16 IMPRESSION: 1. Large heterogeneously enhancing mass (5.4 x 4.0 x 4.1 cm) in the central right liver with invasion of right hepatic vein/IVC and right portal vein. Multiple satellite lesions are identified and other scattered hypervascular lesion showing early washout are seen elsewhere in the liver parenchyma. Together, these imaging features are highly suspicious for hepatocellular carcinoma with metastatic or multifocal hepatic disease. 2. Small right adrenal nodule.  Metastatic involvement not excluded.      07/10/2016 Imaging    CT chest w/ contrast 07/10/16 IMPRESSION: 1. Acute small burden pulmonary embolism in the left lower lobe segmental and  subsegmental branches. These may represent tumor thrombi. 2. Re- demonstration of known infiltrative liver mass in the central right liver lobe with increasing IVC invasion compared to the 06/12/2015 MRI. 3. A few scattered tiny pulmonary nodules, largest 4 mm, indeterminate.      07/15/2016 Initial Biopsy    Biopsy of the liver on 07/15/16 unfortunately revealed hepatocellular carcinoma.      07/17/2016 Initial Diagnosis    Hepatocellular carcinoma ([place])       HISTORY OF PRESENTING ILLNESS (07/03/2016):  Kristen Everett 61 y.o. female  originally presented to the ED on 05/06/2016 complaining of acute onset epigastric pain with vomiting. CT abdomen pelvis at that time showed a 4.5 cm heterogeneously enhancing lesion in the central right hepatic lobe. This is suspicious for hepatic neoplasm, with abscess considered less likely. There was also a moderate amount of free intraperitoneal air, which appears to be due to perforated ulcer involving the duodenal bulb. Duodenal ulcer was repaired with a patch on 05/06/2016.  Abdominal MRI on 06/11/2016 showed a large heterogeneously enhancing mass in the central right liver with invasion of right hepatic vein/IVC and right portal vein. There were multiple satellite lesions identified and other scattered hypervascular lesion showing early washout are seen elsewhere in the liver parenchyma. Also seen was a small right adrenal nodule.   She presents for consultation today with her husband. She is doing well overall. She originally presented with acute epigastric pain with abdominal cramping and sweating, then while in the ED she began to experience nausea. She has been told she had gastritis in the past. She denies  any black stool or bleeding. She denies abdominal pain prior to 05/06/2016. She had not been eating well but has been gaining weight back. She reports fatigue. She denies fever or night sweats.  She was diagnosed with Hepatitis C many years ago and was  treated with Harvoni about 4-5 years ago. She no longer follows with liver specialist but, continues to follow with her PCP. She does not know how she contracted Hepatis C. She never used intravenous drugs or received blood. She was told she had a fatty liver while being treated for Hep C.   She has high blood pressure and takes Lipitor for cholesterol. Her mother had breast cancer and was diagnosed at age 70. She smokes less than 1/2 ppd and has been smoking about 20 years. She has been cutting back since she was hospitalized. States she has a heart murmur.   CURRENT THERAPY: Xarelto starter pack given on 07/10/16. Pending IR consultation and consultation in liver clinic  INTERVAL HISTORY: Kristen Everett presents today for follow up to discuss PE of the left lower lung and her recent liver biopsy. Liver biopsy on 07/15/16 revealed hepatocellular carcinoma. She is on Xarelto, tolerating well. She denies bleeding or nausea. She states she is well.  MEDICAL HISTORY:  Past Medical History:  Diagnosis Date  . Anxiety   . Blood dyscrasia   . Family history of adverse reaction to anesthesia    " MY SISTER    WOKE UP "  . Hepatitis    HEPATITIS C    TREATED WITH HARVONI   . Hypertension   . Ulcer ([place]) 05/06/2016   PERFORATED DUODENAL ULCER (    SURGICAL HISTORY: Past Surgical History:  Procedure Laterality Date  . LAPAROSCOPY N/A 05/06/2016   Procedure: DIAGNOSTIC LAPAROSCOPY;  Surgeon: Greer Pickerel, MD;  Location: [place];  Service: General;  Laterality: N/A;  . LAPAROTOMY N/A 05/06/2016   Procedure: EXPLORATORY LAPAROTOMY;  Surgeon: Greer Pickerel, MD;  Location: [place];  Service: General;  Laterality: N/A;  . REPAIR OF PERFORATED ULCER N/A 05/06/2016   Procedure: REPAIR OF PERFORATED DUODENAL ULCER;  Surgeon: Greer Pickerel, MD;  Location: [place];  Service: General;  Laterality: N/A;    SOCIAL HISTORY: Social History   Social History  . Marital status: Married    Spouse name: N/A  . Number of  children: N/A  . Years of education: N/A   Occupational History  . Not on file.   Social History Main Topics  . Smoking status: Current Every Day Smoker    Packs/day: 0.50    Years: 26.00    Types: Cigarettes  . Smokeless tobacco: Never Used  . Alcohol use No  . Drug use: No  . Sexual activity: Not on file   Other Topics Concern  . Not on file   Social History Narrative  . No narrative on file    FAMILY HISTORY: Family History  Problem Relation Age of Onset  . Cancer Mother 32    breast cancer    ALLERGIES:  has No Known Allergies.  MEDICATIONS:  Current Outpatient Prescriptions  Medication Sig Dispense Refill  . amLODipine (NORVASC) 10 MG tablet Take 10 mg by mouth daily.    Marland Kitchen atenolol (TENORMIN) 25 MG tablet Take 25 mg by mouth daily.     Marland Kitchen atorvastatin (LIPITOR) 20 MG tablet Take 20 mg by mouth daily.     . Rivaroxaban (XARELTO STARTER PACK) 15 & 20 MG TBPK Take as directed on package:  Start with one 15mg  tablet by mouth twice a day with food. On Day 22, switch to one 20mg  tablet once a day with food. 51 each 0   No current facility-administered medications for this visit.     REVIEW OF SYSTEMS:   Constitutional: Denies fevers, chills or abnormal night sweats (+) mild fatigue Eyes: Denies blurriness of vision, double vision or watery eyes Ears, nose, mouth, throat, and face: Denies mucositis or sore throat Respiratory: Denies cough, dyspnea or wheezes Cardiovascular: Denies palpitation, chest discomfort or lower extremity swelling Gastrointestinal:  Denies nausea, heartburn or change in bowel habits Skin: Denies abnormal skin rashes Lymphatics: Denies new lymphadenopathy or easy bruising Neurological:Denies numbness, tingling or new weaknesses Behavioral/Psych: Mood is stable, no new changes  All other systems were reviewed with the patient and are negative.  PHYSICAL EXAMINATION: ECOG PERFORMANCE STATUS: 0 - Asymptomatic  Vitals:   07/17/16 1346  BP:  (!) 162/75  Pulse: 97  Resp: 18  Temp: 98.5 F (36.9 C)   Filed Weights   07/17/16 1346  Weight: 143 lb 8 oz (65.1 kg)    GENERAL:alert, no distress and comfortable SKIN: skin color, texture, turgor are normal, no rashes or significant lesions EYES: normal, conjunctiva are pink and non-injected, sclera clear OROPHARYNX:no exudate, no erythema and lips, buccal mucosa, and tongue normal  NECK: supple, thyroid normal size, non-tender, without nodularity LYMPH:  no palpable lymphadenopathy in the cervical, axillary or inguinal LUNGS: clear to auscultation and percussion with normal breathing effort HEART: regular rate and no lower extremity edema (+) murmur heard ABDOMEN:abdomen soft, non-tender and normal bowel sounds. No hepatomegaly appreciated. (+) well healed midline Surgical scar with mild tenderness. Musculoskeletal:no cyanosis of digits and no clubbing  PSYCH: alert & oriented x 3 with fluent speech NEURO: no focal motor/sensory deficits  LABORATORY DATA:  I have reviewed the data as listed CBC Latest Ref Rng & Units 07/15/2016 07/03/2016 05/09/2016  WBC 4.0 - 10.5 K/uL 10.4 9.2 8.5  Hemoglobin 12.0 - 15.0 g/dL 13.4 13.4 13.3  Hematocrit 36.0 - 46.0 % 40.4 40.1 39.5  Platelets 150 - 400 K/uL 195 157 155    CMP Latest Ref Rng & Units 07/03/2016 05/09/2016 05/07/2016  Glucose 70 - 140 mg/dl 110 96 109(H)  BUN 7.0 - 26.0 mg/dL 10.5 8 6   Creatinine 0.6 - 1.1 mg/dL 0.8 0.81 0.78  Sodium 136 - 145 mEq/L 142 134(L) 136  Potassium 3.5 - 5.1 mEq/L 3.6 4.0 3.8  Chloride 101 - 111 mmol/L - 102 104  CO2 22 - 29 mEq/L 23 21(L) 23  Calcium 8.4 - 10.4 mg/dL 10.0 9.1 9.1  Total Protein 6.4 - 8.3 g/dL 7.8 - -  Total Bilirubin 0.20 - 1.20 mg/dL 0.31 - -  Alkaline Phos 40 - 150 U/L 118 - -  AST 5 - 34 U/L 42(H) - -  ALT 0 - 55 U/L 59(H) - -    Results for DENISS, WORMLEY (MRN [id number]) as of 07/10/2016 16:35  Ref. Range 07/03/2016 15:42 07/03/2016 15:42  AFP, Serum, Tumor Marker Latest Ref  Range: 0.0 - 8.3 ng/mL  127.8 (H)  CA 19-9 Latest Ref Range: 0 - 35 U/mL  61 (H)  CEA ([facility]) Latest Ref Range: 0.00 - 5.00 ng/mL 5.43 (H)    Results for JAVIANA, ANWAR (MRN [id number]) as of 07/17/2016 14:13  Ref. Range 05/06/2016 12:19 07/03/2016 15:42 07/15/2016 08:25  Protime Latest Ref Range: 10.6 - 13.4 Seconds  12.0   Prothrombin Time Latest  Ref Range: 11.4 - 15.2 seconds 13.3  13.6  INR Unknown 1.01 1.00 (L) 1.04    PATHOLOGY Diagnosis 07/15/16 Liver, needle/core biopsy - HEPATOCELLULAR CARCINOMA - SEE COMMENT. Microscopic Comment The malignant cells are positive for Hepar-1, Arginase, and Glypican. They are negative are PAX-8, cytokeratin 7, and cytokeratin 20. There is focal positive staining for CD10. Overall, the findings are consistent with hepatocellular carcinoma. Dr. Willeen Niece has reviewed the case and concurs with this interpretation. Dr. Burr Medico was paged on 07/17/16. Additional studies can be performed upon clinician request. (JBK:gt, 07/17/16) Enid Cutter MD Pathologist, Electronic Signature (Case signed 07/17/2016)  RADIOGRAPHIC STUDIES: I have personally reviewed the radiological images as listed and agreed with the findings in the report. Ct Chest W Contrast  Result Date: 07/10/2016 CLINICAL DATA:  Findings suspicious for multifocal hepatocellular carcinoma in the liver on recent MRI abdomen study. Patient presents for chest staging. EXAM: CT CHEST WITH CONTRAST TECHNIQUE: Multidetector CT imaging of the chest was performed during intravenous contrast administration. CONTRAST:  43mL ISOVUE-300 IOPAMIDOL (ISOVUE-300) INJECTION 61% COMPARISON:  06/11/2016 MRI abdomen and 05/06/2016 CT abdomen/pelvis. FINDINGS: Cardiovascular: Normal heart size. No significant pericardial fluid/thickening. Atherosclerotic nonaneurysmal abdominal aorta. Normal caliber pulmonary arteries. Acute pulmonary emboli are present within segmental and subsegmental left lower lobe pulmonary artery  branches (series 2/ images 91 - 101). Mediastinum/Nodes: No discrete thyroid nodules. Fluid level in the mid to lower thoracic esophagus suggesting esophageal dysmotility and/ or gastroesophageal reflux. No pathologically enlarged axillary, mediastinal or hilar lymph nodes. Lungs/Pleura: No pneumothorax. No pleural effusion. Mild-to-moderate centrilobular emphysema. No acute consolidative airspace disease or lung masses. There a few scattered tiny pulmonary nodules in both lungs measuring up to the 4 mm in the left upper lobe (series 5/ image 29). Upper abdomen: Re- demonstration of known infiltrative mass in the central right liver lobe with increasing IVC invasion (series 2/ image 131) compared to the 06/12/2015 MRI. Right adrenal 1.1 cm nodule is stable since 05/06/2016 and demonstrates signal loss on out of phase chemical shift imaging on the 06/11/2016 MRI, compatible with an adenoma. Small gastric diverticulum is noted between the gastric fundus and left adrenal gland. Musculoskeletal: No aggressive appearing focal osseous lesions. Mild thoracic spondylosis. IMPRESSION: 1. Acute small burden pulmonary embolism in the left lower lobe segmental and subsegmental branches. These may represent tumor thrombi. 2. Re- demonstration of known infiltrative liver mass in the central right liver lobe with increasing IVC invasion compared to the 06/12/2015 MRI. 3. A few scattered tiny pulmonary nodules, largest 4 mm, indeterminate. Recommend attention on follow-up chest CT in 3-6 months. 4. Aortic atherosclerosis. 5. Mild-to-moderate centrilobular emphysema. 6. Right adrenal adenoma. Critical Value/emergent results were called by telephone at the time of interpretation on 07/10/2016 at 10:50 am to Dr. Truitt Merle , who verbally acknowledged these results. Electronically Signed   By: Ilona Sorrel M.D.   On: 07/10/2016 11:01   US Biopsy  Result Date: 07/15/2016 INDICATION: Central right lobe liver mass EXAM: ULTRASOUND-GUIDED  BIOPSY OF A CENTRAL RIGHT LOBE LIVER MASS.  CORE. MEDICATIONS: None. ANESTHESIA/SEDATION: Fentanyl 100 mcg IV; Versed 2 mg IV Moderate Sedation Time:  10 The patient was continuously monitored during the procedure by the interventional radiology nurse under my direct supervision. FLUOROSCOPY TIME:  None. COMPLICATIONS: None immediate. PROCEDURE: Informed written consent was obtained from the patient after a thorough discussion of the procedural risks, benefits and alternatives. All questions were addressed. Maximal Sterile Barrier Technique was utilized including caps, mask, sterile gowns, sterile gloves, sterile  drape, hand hygiene and skin antiseptic. A timeout was performed prior to the initiation of the procedure. The right flank was prepped with ChloraPrep in a sterile fashion, and a sterile drape was applied covering the operative field. A sterile gown and sterile gloves were used for the procedure. Under sonographic guidance, an 17 gauge guide needle was advanced into the central right lobe liver lesion. Subsequently 4 18 gauge core biopsies were obtained. Gel-Foam slurry was injected into the needle tract. The guide needle was removed. Final imaging was performed. Patient tolerated the procedure well without complication. Vital sign monitoring by nursing staff during the procedure will continue as patient is in the special procedures unit for post procedure observation. FINDINGS: The images document guide needle placement within the central right lobe liver lesion. Post biopsy images demonstrate no hemorrhage. IMPRESSION: Successful ultrasound-guided core biopsy of a central right lobe liver lesion. Electronically Signed   By: Marybelle Killings M.D.   On: 07/15/2016 10:53    ASSESSMENT & PLAN:  61 y.o. African-American female with PMH of hepatitis C, which was successfully treated 4-5 years ago, with incidentally found to have a liver mass on CT scan.  1.  HCC, stage IIIB -I discussed imaging results and  reviewed the images in person with the patient and her husband. -CT scan of the chest on 07/10/16 showed acute small burden pulmonary embolism in the left lower lobe segmental and subsegmental branches that may represent tumor thrombi, no evidence of pulmonary metastasis. -Biopsy of the liver on 07/15/16 unfortunately revealed hepatocellular carcinoma. This was discussed with pt and her husband in details, I provide a copy of her biopsy report. -We reviewed the natural history of hepatocellular carcinoma and treatment options. -Due to the central location of significant vascular invasion, multiple satellite lesions, her liver cancer is not resectable. -She is also not an ideal candidate for liver transplant, due to the large size of her tumor and multiple satellite lesions. -I have reviewed her scan findings with interventional radiologist Dr. Kathlene Cote, who feels she is a candidate for Y90 radioembolization. I discussed with patient, and I will refer her to interventional radiology. She agrees. -I'll also refer her to the [facility]. Roosevelt Locks, NP is the representative of that group in [place], I will email her about the referral. We also discussed the options of systemic therapy, including the left neck, immunotherapy with Nivolumab, chemotherapy, etc. I'll results of systemic therapy for future if she is truly not a candidate for liver transplant and when she progressed after Y90.  -The patient will follow up with me in 3 months with lab.  2. LLL pulmonary embolism  -This was found on her staging CT chest. She is asymptomatic  -She has no leg swelling or any sign of DVT. The PET is possible from her tumor thrombosis in the liver.  -she has started Xarelto loading dose. -Since the patient is on Xarelto, the patient has cancelled her dental appointments (cleaning). I advised her to not have any dental appointments for the next 3 months. -Continue Xarelto.  3. History of  Hep C, treated -Patient was treated for Hepatis C about 4-5 years ago with Harvoni. She no longer follows with a liver specialist but, continues to follow with her PCP.  4. Tobacco use -She smokes less than 1/2 ppd and has been smoking about 20 years.  -She has been cutting back since she was hospitalized. -We discussed smoking cessation and will continue to address this  at future visits.   5. History of duodenal ulcer with perforation -Status post surgical repair by Dr. Redmond Pulling on general 12/27/2015 -She has recovered well from surgery.  Plan -Continue Xarelto. She will notify us when she is on the last week of medication to request a refill. -IR consultation referral for Y90 -Liver clinic referral. -Lab and f/u in 3 months.  Orders Placed This Encounter  Procedures  . Ambulatory referral to Interventional Radiology    Referral Priority:   Urgent    Referral Type:   Consultation    Referral Reason:   Specialty Services Required    Requested Specialty:   Interventional Radiology    Number of Visits Requested:   1  . Ambulatory referral to Gastroenterology    Referral Priority:   Urgent    Referral Type:   Consultation    Referral Reason:   Specialty Services Required    Requested Specialty:   Gastroenterology    Number of Visits Requested:   1    All questions were answered. The patient knows to call the clinic with any problems, questions or concerns. I spent 35 minutes counseling the patient face to face. The total time spent in the appointment was 40 minutes and more than 50% was on counseling.    Truitt Merle, MD 07/17/2016   This document serves as a record of services personally performed by Truitt Merle, MD. It was created on her behalf by Darcus Austin, a trained medical scribe. The creation of this record is based on the scribe's personal observations and the provider's statements to them. This document has been checked and approved by the attending provider.

## 2016-07-17 ENCOUNTER — Ambulatory Visit (HOSPITAL_BASED_OUTPATIENT_CLINIC_OR_DEPARTMENT_OTHER): Payer: Federal, State, Local not specified - PPO | Admitting: Hematology

## 2016-07-17 ENCOUNTER — Telehealth: Payer: Self-pay | Admitting: Hematology

## 2016-07-17 DIAGNOSIS — I2699 Other pulmonary embolism without acute cor pulmonale: Secondary | ICD-10-CM

## 2016-07-17 DIAGNOSIS — C220 Liver cell carcinoma: Secondary | ICD-10-CM | POA: Diagnosis not present

## 2016-07-17 DIAGNOSIS — Z720 Tobacco use: Secondary | ICD-10-CM

## 2016-07-17 DIAGNOSIS — Z7901 Long term (current) use of anticoagulants: Secondary | ICD-10-CM

## 2016-07-17 NOTE — Telephone Encounter (Signed)
Appointments scheduled per 07/17/16 los. Patient was given a copy of AVS report and appointment schedule, per 07/17/16 los. °

## 2016-07-17 NOTE — Telephone Encounter (Signed)
No IR or Liver Clinic referral in [place]. Unable to reach Dr Burr Medico. 07/17/16

## 2016-07-18 ENCOUNTER — Encounter: Payer: Self-pay | Admitting: Hematology

## 2016-07-26 ENCOUNTER — Telehealth: Payer: Self-pay | Admitting: Hematology

## 2016-07-26 ENCOUNTER — Other Ambulatory Visit: Payer: Self-pay | Admitting: Hematology

## 2016-07-26 DIAGNOSIS — C220 Liver cell carcinoma: Secondary | ICD-10-CM

## 2016-07-26 NOTE — Telephone Encounter (Signed)
Faxed records to [facility] [phone]

## 2016-07-30 ENCOUNTER — Ambulatory Visit
Admission: RE | Admit: 2016-07-30 | Discharge: 2016-07-30 | Disposition: A | Discharge status: Home / Self Care | Payer: Federal, State, Local not specified - PPO | Source: Ambulatory Visit | Attending: Hematology | Admitting: Hematology

## 2016-07-30 ENCOUNTER — Other Ambulatory Visit (HOSPITAL_COMMUNITY): Payer: Self-pay | Admitting: Interventional Radiology

## 2016-07-30 DIAGNOSIS — C220 Liver cell carcinoma: Secondary | ICD-10-CM

## 2016-07-30 HISTORY — PX: IR RADIOLOGIST EVAL & MGMT: IMG5224

## 2016-07-30 NOTE — Consult Note (Signed)
Chief Complaint:  Previous hepatitis C, biopsy-proven multifocal hepatocellular carcinoma  Referring Physician(s): Feng,Yan  History of Present Illness: Kristen Everett is a 61 y.o. female who has a remote history of hepatitis C, previously treated in 2014. She presented to the hospital in January 2018 with abdominal pain. CT imaging demonstrated a perforated duodenal ulcer which was treated surgically. Emergency CT imaging at that time demonstrated a central hepatic mass. Following her surgery, MRI was performed. MRI confirmed a 5.4 cm irregular enhancing central right liver mass in segments 7 and 8 abutting the IVC and right hepatic vein. Small scattered satellite lesions present in the liver predominantly in the right lobe. Patient also underwent biopsy confirming hepatocellular carcinoma. She presents today to discuss directed liver therapy. Overall she remains asymptomatic. Excellent performance status. No current abdominal pain, flank pain, nausea, or vomiting. No signs of jaundice.  Past Medical History:  Diagnosis Date  . Anxiety   . Blood dyscrasia   . Family history of adverse reaction to anesthesia    " MY SISTER    WOKE UP "  . Hepatitis    HEPATITIS C    TREATED WITH HARVONI   . Hypertension   . Ulcer 05/06/2016   PERFORATED DUODENAL ULCER (    Past Surgical History:  Procedure Laterality Date  . LAPAROSCOPY N/A 05/06/2016   Procedure: DIAGNOSTIC LAPAROSCOPY;  Surgeon: Greer Pickerel, MD;  Location: [place];  Service: General;  Laterality: N/A;  . LAPAROTOMY N/A 05/06/2016   Procedure: EXPLORATORY LAPAROTOMY;  Surgeon: Greer Pickerel, MD;  Location: [facility] [facility];  Service: General;  Laterality: N/A;  . REPAIR OF PERFORATED ULCER N/A 05/06/2016   Procedure: REPAIR OF PERFORATED DUODENAL ULCER;  Surgeon: Greer Pickerel, MD;  Location: [place];  Service: General;  Laterality: N/A;    Allergies: Patient has no known allergies.  Medications: Prior to Admission medications   Medication  Sig Start Date End Date Taking? Authorizing Provider  amLODipine (NORVASC) 10 MG tablet Take 10 mg by mouth daily. 04/02/16   Historical Provider, MD  atenolol (TENORMIN) 25 MG tablet Take 25 mg by mouth daily.  03/08/16   Historical Provider, MD  atorvastatin (LIPITOR) 20 MG tablet Take 20 mg by mouth daily.  04/06/16   Historical Provider, MD  Rivaroxaban (XARELTO STARTER PACK) 15 & 20 MG TBPK Take as directed on package: Start with one 15mg  tablet by mouth twice a day with food. On Day 22, switch to one 20mg  tablet once a day with food. 07/10/16   Truitt Merle, MD     Family History  Problem Relation Age of Onset  . Cancer Mother 63    breast cancer    Social History   Social History  . Marital status: Married    Spouse name: N/A  . Number of children: N/A  . Years of education: N/A   Social History Main Topics  . Smoking status: Current Every Day Smoker    Packs/day: 0.50    Years: 26.00    Types: Cigarettes  . Smokeless tobacco: Never Used  . Alcohol use No  . Drug use: No  . Sexual activity: Not on file   Other Topics Concern  . Not on file   Social History Narrative  . No narrative on file    ECOG Status: 0 - Asymptomatic  Review of Systems: A 12 point ROS discussed and pertinent positives are indicated in the HPI above.  All other systems are negative.  Review of Systems  Vital Signs: BP 137/74 (BP Location: Left Arm, Patient Position: Sitting, Cuff Size: Normal)   Pulse 78   Temp 98.4 F (36.9 C) (Oral)   Resp 14   Ht 5\' 6"  (1.676 m)   Wt 143 lb (64.9 kg)   SpO2 99%   BMI 23.08 kg/m   Physical Exam  Constitutional: She is oriented to person, place, and time. She appears well-developed and well-nourished. No distress.  Eyes: Conjunctivae are normal. No scleral icterus.  Cardiovascular: Normal rate, regular rhythm, normal heart sounds and intact distal pulses.   Pulmonary/Chest: Effort normal and breath sounds normal. No respiratory distress. She has no  wheezes.  Abdominal: Soft. Bowel sounds are normal. She exhibits no distension and no mass. There is no tenderness.  Neurological: She is alert and oriented to person, place, and time.  Skin: Skin is warm and dry. No rash noted. She is not diaphoretic. No erythema.  Psychiatric: She has a normal mood and affect. Her behavior is normal.    Mallampati Score:   2  Imaging: Ct Chest W Contrast  Result Date: 07/10/2016 CLINICAL DATA:  Findings suspicious for multifocal hepatocellular carcinoma in the liver on recent MRI abdomen study. Patient presents for chest staging. EXAM: CT CHEST WITH CONTRAST TECHNIQUE: Multidetector CT imaging of the chest was performed during intravenous contrast administration. CONTRAST:  12mL ISOVUE-300 IOPAMIDOL (ISOVUE-300) INJECTION 61% COMPARISON:  06/11/2016 MRI abdomen and 05/06/2016 CT abdomen/pelvis. FINDINGS: Cardiovascular: Normal heart size. No significant pericardial fluid/thickening. Atherosclerotic nonaneurysmal abdominal aorta. Normal caliber pulmonary arteries. Acute pulmonary emboli are present within segmental and subsegmental left lower lobe pulmonary artery branches (series 2/ images 91 - 101). Mediastinum/Nodes: No discrete thyroid nodules. Fluid level in the mid to lower thoracic esophagus suggesting esophageal dysmotility and/ or gastroesophageal reflux. No pathologically enlarged axillary, mediastinal or hilar lymph nodes. Lungs/Pleura: No pneumothorax. No pleural effusion. Mild-to-moderate centrilobular emphysema. No acute consolidative airspace disease or lung masses. There a few scattered tiny pulmonary nodules in both lungs measuring up to the 4 mm in the left upper lobe (series 5/ image 29). Upper abdomen: Re- demonstration of known infiltrative mass in the central right liver lobe with increasing IVC invasion (series 2/ image 131) compared to the 06/12/2015 MRI. Right adrenal 1.1 cm nodule is stable since 05/06/2016 and demonstrates signal loss on out  of phase chemical shift imaging on the 06/11/2016 MRI, compatible with an adenoma. Small gastric diverticulum is noted between the gastric fundus and left adrenal gland. Musculoskeletal: No aggressive appearing focal osseous lesions. Mild thoracic spondylosis. IMPRESSION: 1. Acute small burden pulmonary embolism in the left lower lobe segmental and subsegmental branches. These may represent tumor thrombi. 2. Re- demonstration of known infiltrative liver mass in the central right liver lobe with increasing IVC invasion compared to the 06/12/2015 MRI. 3. A few scattered tiny pulmonary nodules, largest 4 mm, indeterminate. Recommend attention on follow-up chest CT in 3-6 months. 4. Aortic atherosclerosis. 5. Mild-to-moderate centrilobular emphysema. 6. Right adrenal adenoma. Critical Value/emergent results were called by telephone at the time of interpretation on 07/10/2016 at 10:50 am to Dr. Truitt Merle , who verbally acknowledged these results. Electronically Signed   By: Ilona Sorrel M.D.   On: 07/10/2016 11:01   US Biopsy  Result Date: 07/15/2016 INDICATION: Central right lobe liver mass EXAM: ULTRASOUND-GUIDED BIOPSY OF A CENTRAL RIGHT LOBE LIVER MASS.  CORE. MEDICATIONS: None. ANESTHESIA/SEDATION: Fentanyl 100 mcg IV; Versed 2 mg IV Moderate Sedation Time:  10 The patient was  continuously monitored during the procedure by the interventional radiology nurse under my direct supervision. FLUOROSCOPY TIME:  None. COMPLICATIONS: None immediate. PROCEDURE: Informed written consent was obtained from the patient after a thorough discussion of the procedural risks, benefits and alternatives. All questions were addressed. Maximal Sterile Barrier Technique was utilized including caps, mask, sterile gowns, sterile gloves, sterile drape, hand hygiene and skin antiseptic. A timeout was performed prior to the initiation of the procedure. The right flank was prepped with ChloraPrep in a sterile fashion, and a sterile drape was  applied covering the operative field. A sterile gown and sterile gloves were used for the procedure. Under sonographic guidance, an 17 gauge guide needle was advanced into the central right lobe liver lesion. Subsequently 4 18 gauge core biopsies were obtained. Gel-Foam slurry was injected into the needle tract. The guide needle was removed. Final imaging was performed. Patient tolerated the procedure well without complication. Vital sign monitoring by nursing staff during the procedure will continue as patient is in the special procedures unit for post procedure observation. FINDINGS: The images document guide needle placement within the central right lobe liver lesion. Post biopsy images demonstrate no hemorrhage. IMPRESSION: Successful ultrasound-guided core biopsy of a central right lobe liver lesion. Electronically Signed   By: Marybelle Killings M.D.   On: 07/15/2016 10:53    Labs:  CBC:  Recent Labs  05/07/16 1040 05/09/16 0450 07/03/16 1542 07/15/16 0825  WBC 11.6* 8.5 9.2 10.4  HGB 12.9 13.3 13.4 13.4  HCT 38.2 39.5 40.1 40.4  PLT 133* 155 157 195    COAGS:  Recent Labs  05/06/16 1219 07/03/16 1542 07/15/16 0825  INR 1.01 1.00* 1.04  APTT 33  --  42*    BMP:  Recent Labs  05/06/16 0745 05/07/16 1040 05/09/16 0450 07/03/16 1542  NA 141 136 134* 142  K 3.2* 3.8 4.0 3.6  CL 106 104 102  --   CO2 26 23 21* 23  GLUCOSE 205* 109* 96 110  BUN 11 6 8  10.5  CALCIUM 9.5 9.1 9.1 10.0  CREATININE 0.85 0.78 0.81 0.8  GFRNONAA >60 >60 >60  --   GFRAA >60 >60 >60  --     LIVER FUNCTION TESTS:  Recent Labs  05/06/16 0745 07/03/16 1542  BILITOT 0.5 0.31  AST 18 42*  ALT 17 59*  ALKPHOS 96 118  PROT 7.3 7.8  ALBUMIN 4.2 4.3    TUMOR MARKERS: No results for input(s): AFPTM, CEA, CA199, CHROMGRNA in the last 8760 hours.  Assessment and Plan:  Newly diagnosed multifocal hepatocellular carcinoma in the setting of prior hepatitis C virus. Nonoperative candidate.  Currently she is asymptomatic. Liver functions, bilirubin, and renal function are within normal limits for liver directed therapy. Our discussion centered around Y 90 radio embolization therapy. The procedure, risks, benefits, and alternatives were all reviewed. The pretreatment vascular mapping, embolization of nontarget-vasculature, and hepatic pulmonary shunt calculation all reviewed followed by separate day for the actual Y 90 therapy. All questions were addressed. She has a clear understanding of the procedure. She would like to proceed with the workup and scheduling this as soon as possible.  Plan: Schedule for pre-Y 90 visceral angiograms as soon as possible. Anticipate treatment date within the next few weeks.  Thank you for this interesting consult.  I greatly enjoyed meeting Kristen Everett and look forward to participating in their care.  A copy of this report was sent to the requesting provider on this date.  Electronically Signed: Greggory Keen 07/30/2016, 10:27 AM   I spent a total of  40 Minutes   in face to face in clinical consultation, greater than 50% of which was counseling/coordinating care for this patient with multifocal hepatocellular carcinoma in setting of hepatitis C.

## 2016-07-31 ENCOUNTER — Other Ambulatory Visit (HOSPITAL_COMMUNITY): Payer: Self-pay | Admitting: Interventional Radiology

## 2016-07-31 DIAGNOSIS — C220 Liver cell carcinoma: Secondary | ICD-10-CM

## 2016-08-01 ENCOUNTER — Encounter (HOSPITAL_COMMUNITY): Payer: Self-pay

## 2016-08-01 ENCOUNTER — Ambulatory Visit (HOSPITAL_COMMUNITY)
Admission: RE | Admit: 2016-08-01 | Discharge: 2016-08-01 | Disposition: A | Discharge status: Home / Self Care | Payer: Federal, State, Local not specified - PPO | Source: Ambulatory Visit | Attending: Interventional Radiology | Admitting: Interventional Radiology

## 2016-08-01 DIAGNOSIS — C220 Liver cell carcinoma: Secondary | ICD-10-CM | POA: Insufficient documentation

## 2016-08-01 DIAGNOSIS — I81 Portal vein thrombosis: Secondary | ICD-10-CM | POA: Diagnosis not present

## 2016-08-01 DIAGNOSIS — I700 Atherosclerosis of aorta: Secondary | ICD-10-CM | POA: Insufficient documentation

## 2016-08-01 DIAGNOSIS — I70208 Unspecified atherosclerosis of native arteries of extremities, other extremity: Secondary | ICD-10-CM | POA: Diagnosis not present

## 2016-08-01 MED ORDER — IOPAMIDOL (ISOVUE-300) INJECTION 61%
INTRAVENOUS | Status: AC
  Administered 2016-08-01: 100 mL
  Filled 2016-08-01: qty 100

## 2016-08-05 ENCOUNTER — Other Ambulatory Visit: Payer: Self-pay | Admitting: *Deleted

## 2016-08-05 ENCOUNTER — Telehealth: Payer: Self-pay | Admitting: *Deleted

## 2016-08-05 DIAGNOSIS — I2699 Other pulmonary embolism without acute cor pulmonale: Secondary | ICD-10-CM

## 2016-08-05 MED ORDER — RIVAROXABAN 20 MG PO TABS: 20.0000 mg | ORAL_TABLET | Freq: Every day | ORAL | 5 refills | Status: AC

## 2016-08-05 NOTE — Telephone Encounter (Signed)
Pt called to inform Dr. Burr Medico that she had started Xarelto 20 mg daily on Sat  08/03/16.   Pt will need script called in to [facility] on [address] if pt is to continue with medication. Pt's   Phone     [phone].

## 2016-08-05 NOTE — Telephone Encounter (Signed)
Yes, please refill Xarelto 20mg , #30, with 5 refills. Thanks.    Truitt Merle MD

## 2016-08-06 ENCOUNTER — Ambulatory Visit (HOSPITAL_BASED_OUTPATIENT_CLINIC_OR_DEPARTMENT_OTHER): Payer: Federal, State, Local not specified - PPO

## 2016-08-06 ENCOUNTER — Ambulatory Visit (HOSPITAL_BASED_OUTPATIENT_CLINIC_OR_DEPARTMENT_OTHER): Payer: Federal, State, Local not specified - PPO | Admitting: Hematology

## 2016-08-06 ENCOUNTER — Other Ambulatory Visit: Payer: Self-pay | Admitting: Hematology

## 2016-08-06 ENCOUNTER — Telehealth: Payer: Self-pay | Admitting: Hematology

## 2016-08-06 VITALS — BP 163/78 | HR 84 | Temp 98.8°F | Resp 18 | Ht 66.0 in | Wt 146.3 lb

## 2016-08-06 DIAGNOSIS — C220 Liver cell carcinoma: Secondary | ICD-10-CM

## 2016-08-06 DIAGNOSIS — Z720 Tobacco use: Secondary | ICD-10-CM

## 2016-08-06 DIAGNOSIS — Z7901 Long term (current) use of anticoagulants: Secondary | ICD-10-CM | POA: Diagnosis not present

## 2016-08-06 DIAGNOSIS — I2699 Other pulmonary embolism without acute cor pulmonale: Secondary | ICD-10-CM

## 2016-08-06 LAB — CBC WITH DIFFERENTIAL/PLATELET
BASO%: 0.1 % (ref 0.0–2.0)
Basophils Absolute: 0 10*3/uL (ref 0.0–0.1)
EOS%: 2.4 % (ref 0.0–7.0)
Eosinophils Absolute: 0.2 10*3/uL (ref 0.0–0.5)
HEMATOCRIT: 38.2 % (ref 34.8–46.6)
HGB: 12.8 g/dL (ref 11.6–15.9)
LYMPH#: 3.5 10*3/uL — AB (ref 0.9–3.3)
LYMPH%: 40.5 % (ref 14.0–49.7)
MCH: 29.9 pg (ref 25.1–34.0)
MCHC: 33.5 g/dL (ref 31.5–36.0)
MCV: 89.3 fL (ref 79.5–101.0)
MONO#: 0.6 10*3/uL (ref 0.1–0.9)
MONO%: 6.8 % (ref 0.0–14.0)
NEUT#: 4.3 10*3/uL (ref 1.5–6.5)
NEUT%: 50.2 % (ref 38.4–76.8)
Platelets: 123 10*3/uL — ABNORMAL LOW (ref 145–400)
RBC: 4.28 10*6/uL (ref 3.70–5.45)
RDW: 13.4 % (ref 11.2–14.5)
WBC: 8.5 10*3/uL (ref 3.9–10.3)

## 2016-08-06 LAB — COMPREHENSIVE METABOLIC PANEL
ALBUMIN: 4.5 g/dL (ref 3.5–5.0)
ALK PHOS: 148 U/L (ref 40–150)
ALT: 34 U/L (ref 0–55)
AST: 32 U/L (ref 5–34)
Anion Gap: 10 mEq/L (ref 3–11)
BILIRUBIN TOTAL: 0.65 mg/dL (ref 0.20–1.20)
BUN: 8.4 mg/dL (ref 7.0–26.0)
CALCIUM: 10.4 mg/dL (ref 8.4–10.4)
CO2: 26 mEq/L (ref 22–29)
Chloride: 108 mEq/L (ref 98–109)
Creatinine: 0.8 mg/dL (ref 0.6–1.1)
GLUCOSE: 108 mg/dL (ref 70–140)
POTASSIUM: 3.8 meq/L (ref 3.5–5.1)
SODIUM: 143 meq/L (ref 136–145)
TOTAL PROTEIN: 8.1 g/dL (ref 6.4–8.3)

## 2016-08-06 NOTE — Progress Notes (Signed)
[place]  [phone] [phone]  Clinic Follow Up Note   Patient Care Team: Thurman Coyer, MD as PCP - General (Internal Medicine) Greer Pickerel, MD as Consulting Physician (General Surgery) Truitt Merle, MD as Consulting Physician (Hematology) 08/06/2016   CHIEF COMPLAINTS:  Follow up [name], discuss systemic therapy options   Oncology History   Cancer Staging Hepatocellular carcinoma [facility]) Staging form: Liver, AJCC 8th Edition - Clinical stage from 07/15/2016: Stage IIIB (cT4, cN0, cM0) - Signed by Truitt Merle, MD on 07/17/2016       Pulmonary embolism ([place])   07/10/2016 Imaging    CT chest w/ contrast 07/10/16 IMPRESSION: Acute small burden pulmonary embolism in the left lower lobe segmental and subsegmental branches. These may represent tumor thrombi.      07/10/2016 Miscellaneous    Xarelto starter pack given on 07/10/16. To continue      07/13/2016 Initial Diagnosis    Pulmonary embolism ([facility])      Hepatocellular carcinoma ([name])   05/06/2016 Imaging    CT AP w Contrast 05/06/16 IMPRESSION: 4.5 cm heterogeneously enhancing lesion in the central right hepatic lobe. This is suspicious for hepatic neoplasm, with abscess considered less likely. Bilateral lower lobe atelectasis.      06/11/2016 Imaging    MRI abdomen w w/o contrast 06/11/16 IMPRESSION: 1. Large heterogeneously enhancing mass (5.4 x 4.0 x 4.1 cm) in the central right liver with invasion of right hepatic vein/IVC and right portal vein. Multiple satellite lesions are identified and other scattered hypervascular lesion showing early washout are seen elsewhere in the liver parenchyma. Together, these imaging features are highly suspicious for hepatocellular carcinoma with metastatic or multifocal hepatic disease. 2. Small right adrenal nodule.  Metastatic involvement not excluded.      07/10/2016 Imaging    CT chest w/ contrast 07/10/16 IMPRESSION: 1. Acute small burden pulmonary  embolism in the left lower lobe segmental and subsegmental branches. These may represent tumor thrombi. 2. Re- demonstration of known infiltrative liver mass in the central right liver lobe with increasing IVC invasion compared to the 06/12/2015 MRI. 3. A few scattered tiny pulmonary nodules, largest 4 mm, indeterminate.      07/15/2016 Initial Biopsy    Biopsy of the liver on 07/15/16 confirmed hepatocellular carcinoma.      07/15/2016 Initial Diagnosis    Hepatocellular carcinoma ([name])      08/01/2016 Miscellaneous    Pt was seen and evaluated by IR Dr. Annamaria Boots and was felt not to be a candidate for embolization due to the extensive portal vein and IVC thrombosis        08/01/2016 Imaging    Abdomen CTA showed:  Progression of the portal vein thrombosis. There is now thrombus at the portal vein bifurcation. Right portal vein is occluded and proximal thrombus in the left portal vein. Evidence for early cavernous transformation in the porta hepatis.  Marked progression of the tumor thrombus in the IVC. This tumor thrombus measures up to 5 cm and extending cephalad towards the right atrium        HISTORY OF PRESENTING ILLNESS (07/03/2016):  Kristen Everett 61 y.o. female  originally presented to the ED on 05/06/2016 complaining of acute onset epigastric pain with vomiting. CT abdomen pelvis at that time showed a 4.5 cm heterogeneously enhancing lesion in the central right hepatic lobe. This is suspicious for hepatic neoplasm, with abscess considered less likely. There was also a moderate amount of free intraperitoneal air, which appears to  be due to perforated ulcer involving the duodenal bulb. Duodenal ulcer was repaired with a patch on 05/06/2016.  Abdominal MRI on 06/11/2016 showed a large heterogeneously enhancing mass in the central right liver with invasion of right hepatic vein/IVC and right portal vein. There were multiple satellite lesions identified and other scattered hypervascular lesion  showing early washout are seen elsewhere in the liver parenchyma. Also seen was a small right adrenal nodule.   She presents for consultation today with her husband. She is doing well overall. She originally presented with acute epigastric pain with abdominal cramping and sweating, then while in the ED she began to experience nausea. She has been told she had gastritis in the past. She denies any black stool or bleeding. She denies abdominal pain prior to 05/06/2016. She had not been eating well but has been gaining weight back. She reports fatigue. She denies fever or night sweats.  She was diagnosed with Hepatitis C many years ago and was treated with Harvoni about 4-5 years ago. She no longer follows with liver specialist but, continues to follow with her PCP. She does not know how she contracted Hepatis C. She never used intravenous drugs or received blood. She was told she had a fatty liver while being treated for Hep C.   She has high blood pressure and takes Lipitor for cholesterol. Her mother had breast cancer and was diagnosed at age 67. She smokes less than 1/2 ppd and has been smoking about 20 years. She has been cutting back since she was hospitalized. States she has a heart murmur.   CURRENT THERAPY:  1. Xarelto starter pack given on 07/10/16.   2. Pending systemic therapy   INTERVAL HISTORY: Kristen Everett presents today with her husband to follow up. She was seen and evaluated by interventional radiologist Dr. Annamaria Boots who felt she is not a candidate for Y 90 due to the extensive tumor board in portal vein and IVC. She was referred back to me to discuss systemic therapy options. She feels well overall, denies any significant pain or other symptoms. She has been compliant with Xarelto, and tolerates well.    MEDICAL HISTORY:  Past Medical History:  Diagnosis Date  . Anxiety   . Blood dyscrasia   . Family history of adverse reaction to anesthesia    " MY SISTER    WOKE UP "  . Hepatitis      HEPATITIS C    TREATED WITH HARVONI   . Hypertension   . Ulcer 05/06/2016   PERFORATED DUODENAL ULCER (    SURGICAL HISTORY: Past Surgical History:  Procedure Laterality Date  . LAPAROSCOPY N/A 05/06/2016   Procedure: DIAGNOSTIC LAPAROSCOPY;  Surgeon: Greer Pickerel, MD;  Location: [facility];  Service: General;  Laterality: N/A;  . LAPAROTOMY N/A 05/06/2016   Procedure: EXPLORATORY LAPAROTOMY;  Surgeon: Greer Pickerel, MD;  Location: [employer] [facility];  Service: General;  Laterality: N/A;  . REPAIR OF PERFORATED ULCER N/A 05/06/2016   Procedure: REPAIR OF PERFORATED DUODENAL ULCER;  Surgeon: Greer Pickerel, MD;  Location: [facility];  Service: General;  Laterality: N/A;    SOCIAL HISTORY: Social History   Social History  . Marital status: Married    Spouse name: N/A  . Number of children: N/A  . Years of education: N/A   Occupational History  . Not on file.   Social History Main Topics  . Smoking status: Current Every Day Smoker    Packs/day: 0.50    Years: 26.00  Types: Cigarettes  . Smokeless tobacco: Never Used  . Alcohol use No  . Drug use: No  . Sexual activity: Not on file   Other Topics Concern  . Not on file   Social History Narrative  . No narrative on file    FAMILY HISTORY: Family History  Problem Relation Age of Onset  . Cancer Mother 72    breast cancer    ALLERGIES:  has No Known Allergies.  MEDICATIONS:  Current Outpatient Prescriptions  Medication Sig Dispense Refill  . amLODipine (NORVASC) 10 MG tablet Take 10 mg by mouth daily.    Marland Kitchen atenolol (TENORMIN) 25 MG tablet Take 25 mg by mouth daily.     Marland Kitchen atorvastatin (LIPITOR) 20 MG tablet Take 20 mg by mouth daily.     . Rivaroxaban (XARELTO STARTER PACK) 15 & 20 MG TBPK Take as directed on package: Start with one 15mg  tablet by mouth twice a day with food. On Day 22, switch to one 20mg  tablet once a day with food. 51 each 0  . rivaroxaban (XARELTO) 20 MG TABS tablet Take 1 tablet (20 mg total) by mouth daily with  supper. 30 tablet 5   No current facility-administered medications for this visit.     REVIEW OF SYSTEMS:   Constitutional: Denies fevers, chills or abnormal night sweats (+) mild fatigue Eyes: Denies blurriness of vision, double vision or watery eyes Ears, nose, mouth, throat, and face: Denies mucositis or sore throat Respiratory: Denies cough, dyspnea or wheezes Cardiovascular: Denies palpitation, chest discomfort or lower extremity swelling Gastrointestinal:  Denies nausea, heartburn or change in bowel habits Skin: Denies abnormal skin rashes Lymphatics: Denies new lymphadenopathy or easy bruising Neurological:Denies numbness, tingling or new weaknesses Behavioral/Psych: Mood is stable, no new changes  All other systems were reviewed with the patient and are negative.  PHYSICAL EXAMINATION: ECOG PERFORMANCE STATUS: 0 - Asymptomatic  Vitals:   08/06/16 1549  BP: (!) 163/78  Pulse: 84  Resp: 18  Temp: 98.8 F (37.1 C)   Filed Weights   08/06/16 1549  Weight: 146 lb 4.8 oz (66.4 kg)    GENERAL:alert, no distress and comfortable SKIN: skin color, texture, turgor are normal, no rashes or significant lesions EYES: normal, conjunctiva are pink and non-injected, sclera clear OROPHARYNX:no exudate, no erythema and lips, buccal mucosa, and tongue normal  NECK: supple, thyroid normal size, non-tender, without nodularity LYMPH:  no palpable lymphadenopathy in the cervical, axillary or inguinal LUNGS: clear to auscultation and percussion with normal breathing effort HEART: regular rate and no lower extremity edema (+) systolic murmur ABDOMEN:abdomen soft, non-tender and normal bowel sounds. No hepatomegaly appreciated. (+) well healed midline Surgical scar with mild tenderness. Musculoskeletal:no cyanosis of digits and no clubbing  PSYCH: alert & oriented x 3 with fluent speech NEURO: no focal motor/sensory deficits  LABORATORY DATA:  I have reviewed the data as listed CBC  Latest Ref Rng & Units 08/06/2016 07/15/2016 07/03/2016  WBC 3.9 - 10.3 10e3/uL 8.5 10.4 9.2  Hemoglobin 11.6 - 15.9 g/dL 12.8 13.4 13.4  Hematocrit 34.8 - 46.6 % 38.2 40.4 40.1  Platelets 145 - 400 10e3/uL 123(L) 195 157    CMP Latest Ref Rng & Units 07/03/2016 05/09/2016 05/07/2016  Glucose 70 - 140 mg/dl 110 96 109(H)  BUN 7.0 - 26.0 mg/dL 10.5 8 6   Creatinine 0.6 - 1.1 mg/dL 0.8 0.81 0.78  Sodium 136 - 145 mEq/L 142 134(L) 136  Potassium 3.5 - 5.1 mEq/L 3.6 4.0 3.8  Chloride  101 - 111 mmol/L - 102 104  CO2 22 - 29 mEq/L 23 21(L) 23  Calcium 8.4 - 10.4 mg/dL 10.0 9.1 9.1  Total Protein 6.4 - 8.3 g/dL 7.8 - -  Total Bilirubin 0.20 - 1.20 mg/dL 0.31 - -  Alkaline Phos 40 - 150 U/L 118 - -  AST 5 - 34 U/L 42(H) - -  ALT 0 - 55 U/L 59(H) - -    Results for ARRIANNA, CATALA (MRN [id number]) as of 07/10/2016 16:35  Ref. Range 07/03/2016 15:42 07/03/2016 15:42  AFP, Serum, Tumor Marker Latest Ref Range: 0.0 - 8.3 ng/mL  127.8 (H)  CA 19-9 Latest Ref Range: 0 - 35 U/mL  61 (H)  CEA ([facility]) Latest Ref Range: 0.00 - 5.00 ng/mL 5.43 (H)    Results for CHANDEL, ZAUN (MRN [id number]) as of 07/17/2016 14:13  Ref. Range 05/06/2016 12:19 07/03/2016 15:42 07/15/2016 08:25  Protime Latest Ref Range: 10.6 - 13.4 Seconds  12.0   Prothrombin Time Latest Ref Range: 11.4 - 15.2 seconds 13.3  13.6  INR Unknown 1.01 1.00 (L) 1.04    PATHOLOGY Diagnosis 07/15/16 Liver, needle/core biopsy - HEPATOCELLULAR CARCINOMA - SEE COMMENT. Microscopic Comment The malignant cells are positive for Hepar-1, Arginase, and Glypican. They are negative are PAX-8, cytokeratin 7, and cytokeratin 20. There is focal positive staining for CD10. Overall, the findings are consistent with hepatocellular carcinoma. Dr. Willeen Niece has reviewed the case and concurs with this interpretation. Dr. Burr Medico was paged on 07/17/16. Additional studies can be performed upon clinician request. (JBK:gt, 07/17/16) Enid Cutter MD Pathologist,  Electronic Signature (Case signed 07/17/2016)  RADIOGRAPHIC STUDIES: I have personally reviewed the radiological images as listed and agreed with the findings in the report. Ct Chest W Contrast  Result Date: 07/10/2016 CLINICAL DATA:  Findings suspicious for multifocal hepatocellular carcinoma in the liver on recent MRI abdomen study. Patient presents for chest staging. EXAM: CT CHEST WITH CONTRAST TECHNIQUE: Multidetector CT imaging of the chest was performed during intravenous contrast administration. CONTRAST:  56mL ISOVUE-300 IOPAMIDOL (ISOVUE-300) INJECTION 61% COMPARISON:  06/11/2016 MRI abdomen and 05/06/2016 CT abdomen/pelvis. FINDINGS: Cardiovascular: Normal heart size. No significant pericardial fluid/thickening. Atherosclerotic nonaneurysmal abdominal aorta. Normal caliber pulmonary arteries. Acute pulmonary emboli are present within segmental and subsegmental left lower lobe pulmonary artery branches (series 2/ images 91 - 101). Mediastinum/Nodes: No discrete thyroid nodules. Fluid level in the mid to lower thoracic esophagus suggesting esophageal dysmotility and/ or gastroesophageal reflux. No pathologically enlarged axillary, mediastinal or hilar lymph nodes. Lungs/Pleura: No pneumothorax. No pleural effusion. Mild-to-moderate centrilobular emphysema. No acute consolidative airspace disease or lung masses. There a few scattered tiny pulmonary nodules in both lungs measuring up to the 4 mm in the left upper lobe (series 5/ image 29). Upper abdomen: Re- demonstration of known infiltrative mass in the central right liver lobe with increasing IVC invasion (series 2/ image 131) compared to the 06/12/2015 MRI. Right adrenal 1.1 cm nodule is stable since 05/06/2016 and demonstrates signal loss on out of phase chemical shift imaging on the 06/11/2016 MRI, compatible with an adenoma. Small gastric diverticulum is noted between the gastric fundus and left adrenal gland. Musculoskeletal: No aggressive  appearing focal osseous lesions. Mild thoracic spondylosis. IMPRESSION: 1. Acute small burden pulmonary embolism in the left lower lobe segmental and subsegmental branches. These may represent tumor thrombi. 2. Re- demonstration of known infiltrative liver mass in the central right liver lobe with increasing IVC invasion compared to the 06/12/2015 MRI. 3. A  few scattered tiny pulmonary nodules, largest 4 mm, indeterminate. Recommend attention on follow-up chest CT in 3-6 months. 4. Aortic atherosclerosis. 5. Mild-to-moderate centrilobular emphysema. 6. Right adrenal adenoma. Critical Value/emergent results were called by telephone at the time of interpretation on 07/10/2016 at 10:50 am to Dr. Truitt Merle , who verbally acknowledged these results. Electronically Signed   By: Ilona Sorrel M.D.   On: 07/10/2016 11:01   Ct Angio Abdomen W &/or Wo Contrast  Result Date: 08/02/2016 CLINICAL DATA:  61 year-old with hepatocellular carcinoma. Evaluate vascular anatomy prior to Y90 radioembolization. EXAM: CT ANGIOGRAPHY ABDOMEN TECHNIQUE: Multidetector CT imaging of the abdomen was performed using the standard protocol during bolus administration of intravenous contrast. Multiplanar reconstructed images and MIPs were obtained and reviewed to evaluate the vascular anatomy. CONTRAST:  100 mL Isovue-300 COMPARISON:  MRI 06/11/2016 FINDINGS: VASCULAR Aorta: Mild atherosclerotic disease in the abdominal aorta without aneurysm or a dissection. Celiac: Celiac trunk is widely patent. Patient has variant anatomy in that the left hepatic artery originates from the left gastric artery. Gastroduodenal artery is patent. Right gastric artery is just proximal to the right hepatic artery bifurcation. This is seen on sequence 4, image 31. SMA: SMA is widely patent without any significant stenosis. Renals: Two right renal arteries without significant stenosis. Two left renal arteries without significant stenosis. IMA: Small amount of aortic  plaque near the origin of the IMA. Proximal IMA is patent. Inflow: Large amount of calcified plaque involving the left common iliac artery. Mild atherosclerotic disease in the right common iliac artery. Veins: Iliac veins and renal veins are patent. Previously, there was nonocclusive thrombus in the right portal vein. The thrombus has markedly increased and now the entire right portal venous system is thrombosed. There is also a thrombus in the proximal left main portal vein. The left portal vein beyond the proximal thrombus is patent. There are now prominent collateral veins in the porta hepatis compatible with early cavernous transformation. Portal confluence remains patent. Previously, there was a small amount of clot within the anterior aspect of the IVC but this intraluminal thrombus has markedly progressed. Clot now extends into the IVC just below the right atrium. The tumor thrombus in the IVC is extensive and measuring 5.0 x 3.3 cm on the coronal reformats, sequence 14, image 39. Hepatic veins are not well demonstrated on this examination but assume there is tumor thrombus within the hepatic veins. There is distension of the IVC which may be related to the large clot burden in the proximal IVC. Review of the MIP images confirms the above findings. NON-VASCULAR Lower chest: Few linear densities at the left lung base are suggestive for atelectasis. No pleural effusions. Hepatobiliary: The central right hepatic lesion is poorly characterized on this examination. There is heterogeneous enhancement throughout the right hepatic lobe related to the portal vein thrombosis. There is a hypervascular lesion at the right hepatic dome measuring up to 1.4 cm compatible with a satellite lesion. Neoplastic disease in liver was much better characterized on the MRI study. No distinct lesions identified in the left hepatic lobe. Gallbladder is decompressed. No significant biliary dilatation. Pancreas: Normal appearance of the  pancreas without inflammation or duct dilatation. Spleen: Normal appearance of spleen without enlargement. Adrenals/Urinary Tract: Again noted is an indeterminate lesion in the right adrenal gland measuring up to 1.1 cm and minimally changed. Left adrenal gland is within normal limits. Question some nodal tissue along the medial aspect of the left adrenal gland. 3 mm stone in  the right kidney interpolar region without hydronephrosis. No suspicious renal lesions. Stomach/Bowel: There is a diverticulum along the posterior gastric fundus. No acute abnormality involving the visualized bowel structures. Lymphatic: Slightly prominent soft tissue in the precaval region. There may be a prominent lymph node just medial to left adrenal gland. Other: No ascites. Musculoskeletal: No acute bone abnormality. IMPRESSION: VASCULAR Progression of the portal vein thrombosis. There is now thrombus at the portal vein bifurcation. Right portal vein is occluded and proximal thrombus in the left portal vein. Evidence for early cavernous transformation in the porta hepatis. Marked progression of the tumor thrombus in the IVC. This tumor thrombus measures up to 5 cm and extending cephalad towards the right atrium. Variant hepatic arterial anatomy. Left hepatic artery is replaced off the left gastric artery. Identification of the right gastric artery just proximal to the right hepatic artery bifurcation. NON-VASCULAR Neoplastic disease in the liver is poorly characterized on this examination. Heterogeneous enhancement throughout the right hepatic lobe related to neoplasm and right portal vein thrombosis. Right adrenal gland nodule may be related to the hepatic disease. These results were called by telephone at the time of interpretation on 08/02/2016 at 1:39 pm to Dr. Greggory Keen , who verbally acknowledged these results. Electronically Signed   By: Markus Daft M.D.   On: 08/02/2016 13:47   US Biopsy  Result Date: 07/15/2016 INDICATION:  Central right lobe liver mass EXAM: ULTRASOUND-GUIDED BIOPSY OF A CENTRAL RIGHT LOBE LIVER MASS.  CORE. MEDICATIONS: None. ANESTHESIA/SEDATION: Fentanyl 100 mcg IV; Versed 2 mg IV Moderate Sedation Time:  10 The patient was continuously monitored during the procedure by the interventional radiology nurse under my direct supervision. FLUOROSCOPY TIME:  None. COMPLICATIONS: None immediate. PROCEDURE: Informed written consent was obtained from the patient after a thorough discussion of the procedural risks, benefits and alternatives. All questions were addressed. Maximal Sterile Barrier Technique was utilized including caps, mask, sterile gowns, sterile gloves, sterile drape, hand hygiene and skin antiseptic. A timeout was performed prior to the initiation of the procedure. The right flank was prepped with ChloraPrep in a sterile fashion, and a sterile drape was applied covering the operative field. A sterile gown and sterile gloves were used for the procedure. Under sonographic guidance, an 17 gauge guide needle was advanced into the central right lobe liver lesion. Subsequently 4 18 gauge core biopsies were obtained. Gel-Foam slurry was injected into the needle tract. The guide needle was removed. Final imaging was performed. Patient tolerated the procedure well without complication. Vital sign monitoring by nursing staff during the procedure will continue as patient is in the special procedures unit for post procedure observation. FINDINGS: The images document guide needle placement within the central right lobe liver lesion. Post biopsy images demonstrate no hemorrhage. IMPRESSION: Successful ultrasound-guided core biopsy of a central right lobe liver lesion. Electronically Signed   By: Marybelle Killings M.D.   On: 07/15/2016 10:53    ASSESSMENT & PLAN:  61 y.o. African-American female with PMH of hepatitis C, which was successfully treated 4-5 years ago, with incidentally found to have a liver mass on CT scan,  which was done when she had duodenal ulcer perforation.  1.  HCC, stage IIIB -I previously discussed imaging results and reviewed the images in person with the patient and her husband. -CT scan of the chest on 07/10/16 showed acute small burden pulmonary embolism in the left lower lobe segmental and subsegmental branches that may represent tumor thrombi, no evidence of pulmonary metastasis. -Biopsy  of the liver on 07/15/16 unfortunately revealed hepatocellular carcinoma. This was discussed with pt and her husband in details, I provide a copy of her biopsy report. -We reviewed the natural history of hepatocellular carcinoma and treatment options. -Due to the central location of significant vascular invasion, multiple satellite lesions, her liver cancer is not resectable. -She is also not an ideal candidate for liver transplant, due to the large size of her tumor and multiple satellite lesions. This was confirmed by Liver clinic NP Roosevelt Locks -Unfortunately his CTA showed extensive, rapid progression of the portal thrombosis, and IVC thrombosis which extends close to her right atrium.  -She was developed by interventional radiologist Dr. Annamaria Boots and felt not to be a candidate for radioembolization due to the extensive thrombosis -We discussed that her liver cancer is incurable at this stage. We reviewed overall poor prognosis. Pt and her husband were overwhelmed and did not process the message well. -We discussed the systemic therapy options, which include oral sorafenib, Immunotherapy Nivolumab (IV) and iv chemotherapy. We reviewed the clinical data, including response rate, and potential side effects of these medications. -We discussed the left hip and Nivolumab does not shrink the tumor rapidly. Chemotherapy overall has low response rate in [place], but may shrink her tumor faster if it works. Due to her rapid growth of her tumor thromboses, I am very concerned that her liver function will get worse rapidly and  she may lose the window of treatment.  -I suggest her to try chemotherapy oxaliplatin and gemcitabine first to see if we can control her cancer quickly -if she does not response in 2 months, I will switch her to Nivolumab --Chemotherapy consent: Side effects including but does not not limited to, fatigue, nausea, vomiting, diarrhea, hair loss, neuropathy, fluid retention, renal and kidney dysfunction, neutropenic fever, needed for blood transfusion, bleeding, were discussed with patient in great detail. She agrees to proceed.  -Chemo class this week  -We'll tentatively start her chemotherapy next Friday  2. LLL pulmonary embolism  -This was found on her staging CT chest. She is asymptomatic  -She has no leg swelling or any sign of DVT. The PET is possible from her tumor thrombosis in the liver.  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 Pt prefers to continue Xarelto.  3. History of Hep C, treated -Patient was treated for Hepatis C about 4-5 years ago with Harvoni. She no longer follows with a liver specialist but, continues to follow with her PCP.  4. Tobacco use -She smokes less than 1/2 ppd and has been smoking about 20 years.  -She has been cutting back since she was hospitalized. -We discussed smoking cessation and will continue to address this at future visits.   5. History of duodenal ulcer with perforation -Status post surgical repair by Dr. Redmond Pulling on general 12/27/2015 -She has recovered well from surgery.  Plan Chemo class within one week Lab, f/u and first chemo Gemcitabine and oxalaplatin on 5/11  -Continue Xarelto  No orders of the defined types were placed in this encounter.   All questions were answered. The patient knows to call the clinic with any problems, questions or concerns. I spent  35 minutes counseling the patient face to face. The total time spent in the appointment was 40 minutes and more than 50% was on counseling.  This document serves as a record of services personally performed by Truitt Merle, MD. It was created on her behalf by Joslyn Devon, a trained medical scribe. The creation of this record is based on the scribe's personal observations and the provider's statements to them. This document has been checked and approved by the attending provider.    Truitt Merle, MD 08/06/2016    Addendum Pt's husband called me this morning, frustrated and devastated, wants to explore "anything possible to help my wife". We discussed the option of clinical trial at other institution, he would like me to explore. I contacted Dr. Leamon Arnt at [facility], and will set up her referral to see Dr. More and consider clinical trial options there.   Truitt Merle  08/07/2016

## 2016-08-06 NOTE — Telephone Encounter (Signed)
Gave patient avs report and appointments for May  °

## 2016-08-06 NOTE — Progress Notes (Signed)
Patient ID: Kristen Everett, female   DOB: [birth year], 61 y.o.   MRN: [id number]   Abdomen CTA reviewed in prep for Y90.    Unfortunately, the [facility] has enlarged and there is further tumor thrombus in the portal veins, hepatic veins and extending well into the IVC.  These findings put her at High risk for hepatic failure with any embolization therapy.  Findings discussed with the patient and her husband by phone this morning.  I also called Dr Burr Medico to discuss the CT and she plans on seeing her today to discuss starting chemo.

## 2016-08-07 ENCOUNTER — Encounter: Payer: Self-pay | Admitting: Hematology

## 2016-08-07 ENCOUNTER — Ambulatory Visit: Payer: Federal, State, Local not specified - PPO | Admitting: Hematology

## 2016-08-07 LAB — AFP TUMOR MARKER: AFP, Serum, Tumor Marker: 343.1 ng/mL — ABNORMAL HIGH (ref 0.0–8.3)

## 2016-08-08 ENCOUNTER — Telehealth: Payer: Self-pay | Admitting: Hematology

## 2016-08-08 ENCOUNTER — Encounter: Payer: Self-pay | Admitting: *Deleted

## 2016-08-08 NOTE — Telephone Encounter (Signed)
FAXED RECORDS TO DR MORSE AT [facility] [phone]

## 2016-08-13 ENCOUNTER — Other Ambulatory Visit: Payer: Federal, State, Local not specified - PPO

## 2016-08-13 ENCOUNTER — Other Ambulatory Visit: Payer: Self-pay | Admitting: Hematology

## 2016-08-13 ENCOUNTER — Encounter: Payer: Self-pay | Admitting: *Deleted

## 2016-08-13 NOTE — Progress Notes (Signed)
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

## 2016-08-15 NOTE — Progress Notes (Signed)
[facility]  Telephone:([phone]) [phone] [phone]) [phone]  Clinic Follow Up Note   Patient Care Team: Cloward, Dianna Rossetti, MD as PCP - General (Internal Medicine) Greer Pickerel, MD as Consulting Physician (General Surgery) Truitt Merle, MD as Consulting Physician (Hematology) 08/16/2016   CHIEF COMPLAINTS:  Follow up [facility], discuss systemic therapy options   Oncology History   Cancer Staging Hepatocellular carcinoma [facility]) Staging form: Liver, AJCC 8th Edition - Clinical stage from 07/15/2016: Stage IIIB (cT4, cN0, cM0) - Signed by Truitt Merle, MD on 07/17/2016       Pulmonary embolism ([place])   07/10/2016 Imaging    CT chest w/ contrast 07/10/16 IMPRESSION: Acute small burden pulmonary embolism in the left lower lobe segmental and subsegmental branches. These may represent tumor thrombi.      07/10/2016 Miscellaneous    Xarelto starter pack given on 07/10/16. To continue      07/13/2016 Initial Diagnosis    Pulmonary embolism (HCC)      Hepatocellular carcinoma ([facility])   05/06/2016 Imaging    CT AP w Contrast 05/06/16 IMPRESSION: 4.5 cm heterogeneously enhancing lesion in the central right hepatic lobe. This is suspicious for hepatic neoplasm, with abscess considered less likely. Bilateral lower lobe atelectasis.      06/11/2016 Imaging    MRI abdomen w w/o contrast 06/11/16 IMPRESSION: 1. Large heterogeneously enhancing mass (5.4 x 4.0 x 4.1 cm) in the central right liver with invasion of right hepatic vein/IVC and right portal vein. Multiple satellite lesions are identified and other scattered hypervascular lesion showing early washout are seen elsewhere in the liver parenchyma. Together, these imaging features are highly suspicious for hepatocellular carcinoma with metastatic or multifocal hepatic disease. 2. Small right adrenal nodule.  Metastatic involvement not excluded.      07/10/2016 Imaging    CT chest w/ contrast 07/10/16 IMPRESSION: 1. Acute small burden pulmonary  embolism in the left lower lobe segmental and subsegmental branches. These may represent tumor thrombi. 2. Re- demonstration of known infiltrative liver mass in the central right liver lobe with increasing IVC invasion compared to the 06/12/2015 MRI. 3. A few scattered tiny pulmonary nodules, largest 4 mm, indeterminate.      07/15/2016 Initial Biopsy    Biopsy of the liver on 07/15/16 confirmed hepatocellular carcinoma.      07/15/2016 Initial Diagnosis    Hepatocellular carcinoma ([place])      08/01/2016 Miscellaneous    Pt was seen and evaluated by IR Dr. Annamaria Boots and was felt not to be a candidate for embolization due to the extensive portal vein and IVC thrombosis        08/01/2016 Imaging    Abdomen CTA showed:  Progression of the portal vein thrombosis. There is now thrombus at the portal vein bifurcation. Right portal vein is occluded and proximal thrombus in the left portal vein. Evidence for early cavernous transformation in the porta hepatis.  Marked progression of the tumor thrombus in the IVC. This tumor thrombus measures up to 5 cm and extending cephalad towards the right atrium        HISTORY OF PRESENTING ILLNESS (07/03/2016):  Kristen Everett 61 y.o. female  originally presented to the ED on 05/06/2016 complaining of acute onset epigastric pain with vomiting. CT abdomen pelvis at that time showed a 4.5 cm heterogeneously enhancing lesion in the central right hepatic lobe. This is suspicious for hepatic neoplasm, with abscess considered less likely. There was also a moderate amount of free intraperitoneal air, which appears to  be due to perforated ulcer involving the duodenal bulb. Duodenal ulcer was repaired with a patch on 05/06/2016.  Abdominal MRI on 06/11/2016 showed a large heterogeneously enhancing mass in the central right liver with invasion of right hepatic vein/IVC and right portal vein. There were multiple satellite lesions identified and other scattered hypervascular lesion  showing early washout are seen elsewhere in the liver parenchyma. Also seen was a small right adrenal nodule.   She presents for consultation today with her husband. She is doing well overall. She originally presented with acute epigastric pain with abdominal cramping and sweating, then while in the ED she began to experience nausea. She has been told she had gastritis in the past. She denies any black stool or bleeding. She denies abdominal pain prior to 05/06/2016. She had not been eating well but has been gaining weight back. She reports fatigue. She denies fever or night sweats.  She was diagnosed with Hepatitis C many years ago and was treated with Harvoni about 4-5 years ago. She no longer follows with liver specialist but, continues to follow with her PCP. She does not know how she contracted Hepatis C. She never used intravenous drugs or received blood. She was told she had a fatty liver while being treated for Hep C.   She has high blood pressure and takes Lipitor for cholesterol. Her mother had breast cancer and was diagnosed at age 32. She smokes less than 1/2 ppd and has been smoking about 20 years. She has been cutting back since she was hospitalized. States she has a heart murmur.   CURRENT THERAPY:  1. Xarelto starter pack given on 07/10/16.   2. Pending systemic therapy vs clinical trial at [place]: Kristen Everett presents today accompanied by husband to follow up. She is doing fine and reports no changes since last visit.  She is awaiting a clinical trial acceptance.     MEDICAL HISTORY:  Past Medical History:  Diagnosis Date  . Anxiety   . Blood dyscrasia   . Family history of adverse reaction to anesthesia    " MY SISTER    WOKE UP "  . Hepatitis    HEPATITIS C    TREATED WITH HARVONI   . Hypertension   . Ulcer 05/06/2016   PERFORATED DUODENAL ULCER (    SURGICAL HISTORY: Past Surgical History:  Procedure Laterality Date  . LAPAROSCOPY N/A 05/06/2016    Procedure: DIAGNOSTIC LAPAROSCOPY;  Surgeon: Greer Pickerel, MD;  Location: [facility];  Service: General;  Laterality: N/A;  . LAPAROTOMY N/A 05/06/2016   Procedure: EXPLORATORY LAPAROTOMY;  Surgeon: Greer Pickerel, MD;  Location: [facility];  Service: General;  Laterality: N/A;  . REPAIR OF PERFORATED ULCER N/A 05/06/2016   Procedure: REPAIR OF PERFORATED DUODENAL ULCER;  Surgeon: Greer Pickerel, MD;  Location: [facility];  Service: General;  Laterality: N/A;    SOCIAL HISTORY: Social History   Social History  . Marital status: Married    Spouse name: N/A  . Number of children: N/A  . Years of education: N/A   Occupational History  . Not on file.   Social History Main Topics  . Smoking status: Current Every Day Smoker    Packs/day: 0.50    Years: 26.00    Types: Cigarettes  . Smokeless tobacco: Never Used  . Alcohol use No  . Drug use: No  . Sexual activity: Not on file   Other Topics Concern  . Not on file  Social History Narrative  . No narrative on file    FAMILY HISTORY: Family History  Problem Relation Age of Onset  . Cancer Mother 6       breast cancer    ALLERGIES:  has No Known Allergies.  MEDICATIONS:  Current Outpatient Prescriptions  Medication Sig Dispense Refill  . amLODipine (NORVASC) 10 MG tablet Take 10 mg by mouth daily.    Marland Kitchen atenolol (TENORMIN) 25 MG tablet Take 25 mg by mouth daily.     Marland Kitchen atorvastatin (LIPITOR) 20 MG tablet Take 20 mg by mouth daily.     . rivaroxaban (XARELTO) 20 MG TABS tablet Take 1 tablet (20 mg total) by mouth daily with supper. 30 tablet 5   No current facility-administered medications for this visit.     REVIEW OF SYSTEMS:   Constitutional: Denies fevers, chills or abnormal night sweats  Eyes: Denies blurriness of vision, double vision or watery eyes Ears, nose, mouth, throat, and face: Denies mucositis or sore throat Respiratory: Denies cough, dyspnea or wheezes Cardiovascular: Denies palpitation, chest discomfort or lower extremity  swelling Gastrointestinal:  Denies nausea, heartburn or change in bowel habits Skin: Denies abnormal skin rashes Lymphatics: Denies new lymphadenopathy or easy bruising Neurological:Denies numbness, tingling or new weaknesses Behavioral/Psych: Mood is stable, no new changes  All other systems were reviewed with the patient and are negative.  PHYSICAL EXAMINATION:  ECOG PERFORMANCE STATUS: 0 - Asymptomatic  Vitals:   08/16/16 1217  BP: (!) 152/71  Pulse: 84  Resp: 18  Temp: 98.9 F (37.2 C)   Filed Weights   08/16/16 1217  Weight: 145 lb 9.6 oz (66 kg)    GENERAL:alert, no distress and comfortable SKIN: skin color, texture, turgor are normal, no rashes or significant lesions EYES: normal, conjunctiva are pink and non-injected, sclera clear OROPHARYNX:no exudate, no erythema and lips, buccal mucosa, and tongue normal  NECK: supple, thyroid normal size, non-tender, without nodularity LYMPH:  no palpable lymphadenopathy in the cervical, axillary or inguinal LUNGS: clear to auscultation and percussion with normal breathing effort HEART: regular rate and no lower extremity edema (+) systolic murmur ABDOMEN:abdomen soft, non-tender and normal bowel sounds. No hepatomegaly appreciated.  Surgical scar with mild tenderness. Musculoskeletal:no cyanosis of digits and no clubbing  PSYCH: alert & oriented x 3 with fluent speech NEURO: no focal motor/sensory deficits  LABORATORY DATA:  I have reviewed the data as listed CBC Latest Ref Rng & Units 08/16/2016 08/06/2016 07/15/2016  WBC 3.9 - 10.3 10e3/uL 7.3 8.5 10.4  Hemoglobin 11.6 - 15.9 g/dL 12.4 12.8 13.4  Hematocrit 34.8 - 46.6 % 37.1 38.2 40.4  Platelets 145 - 400 10e3/uL 123(L) 123(L) 195    CMP Latest Ref Rng & Units 08/16/2016 08/06/2016 07/03/2016  Glucose 70 - 140 mg/dl 117 108 110  BUN 7.0 - 26.0 mg/dL 15.2 8.4 10.5  Creatinine 0.6 - 1.1 mg/dL 0.8 0.8 0.8  Sodium 136 - 145 mEq/L 142 143 142  Potassium 3.5 - 5.1 mEq/L 3.4(L) 3.8  3.6  Chloride 101 - 111 mmol/L - - -  CO2 22 - 29 mEq/L 22 26 23   Calcium 8.4 - 10.4 mg/dL 9.9 10.4 10.0  Total Protein 6.4 - 8.3 g/dL 7.6 8.1 7.8  Total Bilirubin 0.20 - 1.20 mg/dL 0.63 0.65 0.31  Alkaline Phos 40 - 150 U/L 164(H) 148 118  AST 5 - 34 U/L 41(H) 32 42(H)  ALT 0 - 55 U/L 34 34 59(H)    PATHOLOGY Diagnosis 07/15/16 Liver, needle/core biopsy -  HEPATOCELLULAR CARCINOMA - SEE COMMENT. Microscopic Comment The malignant cells are positive for Hepar-1, Arginase, and Glypican. They are negative are PAX-8, cytokeratin 7, and cytokeratin 20. There is focal positive staining for CD10. Overall, the findings are consistent with hepatocellular carcinoma. Dr. Willeen Niece has reviewed the Everett and concurs with this interpretation. Dr. Burr Medico was paged on 07/17/16. Additional studies can be performed upon clinician request. (JBK:gt, 07/17/16) Enid Cutter MD Pathologist, Electronic Signature ([place] signed 07/17/2016)  RADIOGRAPHIC STUDIES: I have personally reviewed the radiological images as listed and agreed with the findings in the report. Ct Angio Abdomen W &/or Wo Contrast  Result Date: 08/02/2016 CLINICAL DATA:  61 year-old with hepatocellular carcinoma. Evaluate vascular anatomy prior to Y90 radioembolization. EXAM: CT ANGIOGRAPHY ABDOMEN TECHNIQUE: Multidetector CT imaging of the abdomen was performed using the standard protocol during bolus administration of intravenous contrast. Multiplanar reconstructed images and MIPs were obtained and reviewed to evaluate the vascular anatomy. CONTRAST:  100 mL Isovue-300 COMPARISON:  MRI 06/11/2016 FINDINGS: VASCULAR Aorta: Mild atherosclerotic disease in the abdominal aorta without aneurysm or a dissection. Celiac: Celiac trunk is widely patent. Patient has variant anatomy in that the left hepatic artery originates from the left gastric artery. Gastroduodenal artery is patent. Right gastric artery is just proximal to the right hepatic artery  bifurcation. This is seen on sequence 4, image 31. SMA: SMA is widely patent without any significant stenosis. Renals: Two right renal arteries without significant stenosis. Two left renal arteries without significant stenosis. IMA: Small amount of aortic plaque near the origin of the IMA. Proximal IMA is patent. Inflow: Large amount of calcified plaque involving the left common iliac artery. Mild atherosclerotic disease in the right common iliac artery. Veins: Iliac veins and renal veins are patent. Previously, there was nonocclusive thrombus in the right portal vein. The thrombus has markedly increased and now the entire right portal venous system is thrombosed. There is also a thrombus in the proximal left main portal vein. The left portal vein beyond the proximal thrombus is patent. There are now prominent collateral veins in the porta hepatis compatible with early cavernous transformation. Portal confluence remains patent. Previously, there was a small amount of clot within the anterior aspect of the IVC but this intraluminal thrombus has markedly progressed. Clot now extends into the IVC just below the right atrium. The tumor thrombus in the IVC is extensive and measuring 5.0 x 3.3 cm on the coronal reformats, sequence 14, image 39. Hepatic veins are not well demonstrated on this examination but assume there is tumor thrombus within the hepatic veins. There is distension of the IVC which may be related to the large clot burden in the proximal IVC. Review of the MIP images confirms the above findings. NON-VASCULAR Lower chest: Few linear densities at the left lung base are suggestive for atelectasis. No pleural effusions. Hepatobiliary: The central right hepatic lesion is poorly characterized on this examination. There is heterogeneous enhancement throughout the right hepatic lobe related to the portal vein thrombosis. There is a hypervascular lesion at the right hepatic dome measuring up to 1.4 cm compatible  with a satellite lesion. Neoplastic disease in liver was much better characterized on the MRI study. No distinct lesions identified in the left hepatic lobe. Gallbladder is decompressed. No significant biliary dilatation. Pancreas: Normal appearance of the pancreas without inflammation or duct dilatation. Spleen: Normal appearance of spleen without enlargement. Adrenals/Urinary Tract: Again noted is an indeterminate lesion in the right adrenal gland measuring up to 1.1 cm  and minimally changed. Left adrenal gland is within normal limits. Question some nodal tissue along the medial aspect of the left adrenal gland. 3 mm stone in the right kidney interpolar region without hydronephrosis. No suspicious renal lesions. Stomach/Bowel: There is a diverticulum along the posterior gastric fundus. No acute abnormality involving the visualized bowel structures. Lymphatic: Slightly prominent soft tissue in the precaval region. There may be a prominent lymph node just medial to left adrenal gland. Other: No ascites. Musculoskeletal: No acute bone abnormality. IMPRESSION: VASCULAR Progression of the portal vein thrombosis. There is now thrombus at the portal vein bifurcation. Right portal vein is occluded and proximal thrombus in the left portal vein. Evidence for early cavernous transformation in the porta hepatis. Marked progression of the tumor thrombus in the IVC. This tumor thrombus measures up to 5 cm and extending cephalad towards the right atrium. Variant hepatic arterial anatomy. Left hepatic artery is replaced off the left gastric artery. Identification of the right gastric artery just proximal to the right hepatic artery bifurcation. NON-VASCULAR Neoplastic disease in the liver is poorly characterized on this examination. Heterogeneous enhancement throughout the right hepatic lobe related to neoplasm and right portal vein thrombosis. Right adrenal gland nodule may be related to the hepatic disease. These results were  called by telephone at the time of interpretation on 08/02/2016 at 1:39 pm to Dr. Greggory Keen , who verbally acknowledged these results. Electronically Signed   By: Markus Daft M.D.   On: 08/02/2016 13:47    ASSESSMENT & PLAN:  61 y.o. African-American female with PMH of hepatitis C, which was successfully treated 4-5 years ago, with incidentally found to have a liver mass on CT scan, which was done when she had duodenal ulcer perforation.  1.  HCC, stage IIIB, unresectable  -I previously discussed imaging results and reviewed the images in person with the patient and her husband. -CT scan of the chest on 07/10/16 showed acute small burden pulmonary embolism in the left lower lobe segmental and subsegmental branches that may represent tumor thrombi, no evidence of pulmonary metastasis. -Biopsy of the liver on 07/15/16 unfortunately revealed hepatocellular carcinoma. This was discussed with pt and her husband in details, I provide a copy of her biopsy report. -We reviewed the natural history of hepatocellular carcinoma and treatment options. -Due to the central location and significant vascular invasion, multiple satellite lesions, her liver cancer is not resectable. -She is also not an ideal candidate for liver transplant, due to the large size of her tumor and multiple satellite lesions. This was confirmed by Liver clinic NP Roosevelt Locks -Unfortunately his CTA showed extensive, rapid progression of the portal thrombosis, and IVC thrombosis which extends close to her right atrium.  -She was developed by interventional radiologist Dr. Annamaria Boots and felt not to be a candidate for radioembolization due to the extensive thrombosis -We discussed that her liver cancer is incurable at this stage. We reviewed overall poor prognosis.  -We discussed the systemic therapy options, which include oral sorafenib, Immunotherapy Nivolumab (IV) and iv chemotherapy, vs clinical trial. -After a lengthy discussion, pt requested  to have a second opinion at [facility] and she will be screened for clinical trial. I refer her last week, she has been scheduled to see Dr. Leamon Arnt on 5/21. I'll send another message to Dr. Leamon Arnt to see if he can see her earlier  2. LLL pulmonary embolism  -This was found on her staging CT chest. She is asymptomatic  -She has no leg swelling or  any sign of DVT. The PET is possible from her tumor thrombosis in the liver.  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 Pt prefers to continue Xarelto.  3. History of Hep C, treated -Patient was treated for Hepatis C about 4-5 years ago with Harvoni. She no longer follows with a liver specialist but, continues to follow with her PCP.  4. Tobacco use -She smokes less than 1/2 ppd and has been smoking about 20 years.  -She has been cutting back since she was hospitalized. -We discussed smoking cessation and will continue to address this at future visits.   5. History of duodenal ulcer with perforation -Status post surgical repair by Dr. Redmond Pulling on general 12/27/2015 -She has recovered well from surgery.  Plan  - I will contact Dr. Leamon Arnt at [facility] to speed up her screening for  Clinical  trial  -Continue Xarelto -she will return if no clinical trial options at [facility], to start chemo here.  -pt is in agreement with the above plan.   No orders of the defined types were placed in this encounter.   All questions were answered. The patient knows to call the clinic with any problems, questions or concerns. I spent 25 minutes counseling the patient face to face. The total time spent in the appointment was 30 minutes and more than 50% was on counseling.   This document serves as a record of services personally performed by Truitt Merle, MD. It was created on her behalf by Brandt Loosen, a trained medical scribe. The creation of this record is based on the scribe's personal observations and the provider's statements to them. This document has been checked and approved by the attending provider.   Levada Schilling 08/16/2016    Addendum Pt's husband called me this morning, frustrated and devastated, wants to explore "anything possible to help my wife". We discussed the option of clinical trial at other institution, he would like me to explore. I contacted Dr. Leamon Arnt at [facility], and will set up her referral to see Dr. More and consider clinical trial options there.   Levada Schilling  08/16/2016

## 2016-08-16 ENCOUNTER — Ambulatory Visit (HOSPITAL_BASED_OUTPATIENT_CLINIC_OR_DEPARTMENT_OTHER): Payer: Federal, State, Local not specified - PPO | Admitting: Hematology

## 2016-08-16 ENCOUNTER — Telehealth: Payer: Self-pay | Admitting: *Deleted

## 2016-08-16 ENCOUNTER — Ambulatory Visit: Payer: Federal, State, Local not specified - PPO

## 2016-08-16 ENCOUNTER — Other Ambulatory Visit (HOSPITAL_BASED_OUTPATIENT_CLINIC_OR_DEPARTMENT_OTHER): Payer: Federal, State, Local not specified - PPO

## 2016-08-16 VITALS — BP 152/71 | HR 84 | Temp 98.9°F | Resp 18 | Ht 66.0 in | Wt 145.6 lb

## 2016-08-16 DIAGNOSIS — Z7901 Long term (current) use of anticoagulants: Secondary | ICD-10-CM | POA: Diagnosis not present

## 2016-08-16 DIAGNOSIS — I81 Portal vein thrombosis: Secondary | ICD-10-CM

## 2016-08-16 DIAGNOSIS — Z86711 Personal history of pulmonary embolism: Secondary | ICD-10-CM | POA: Diagnosis not present

## 2016-08-16 DIAGNOSIS — C220 Liver cell carcinoma: Secondary | ICD-10-CM | POA: Diagnosis not present

## 2016-08-16 DIAGNOSIS — Z720 Tobacco use: Secondary | ICD-10-CM

## 2016-08-16 DIAGNOSIS — I2699 Other pulmonary embolism without acute cor pulmonale: Secondary | ICD-10-CM

## 2016-08-16 DIAGNOSIS — Z86718 Personal history of other venous thrombosis and embolism: Secondary | ICD-10-CM

## 2016-08-16 LAB — COMPREHENSIVE METABOLIC PANEL
ALBUMIN: 4.1 g/dL (ref 3.5–5.0)
ALK PHOS: 164 U/L — AB (ref 40–150)
ALT: 34 U/L (ref 0–55)
AST: 41 U/L — ABNORMAL HIGH (ref 5–34)
Anion Gap: 10 mEq/L (ref 3–11)
BUN: 15.2 mg/dL (ref 7.0–26.0)
CALCIUM: 9.9 mg/dL (ref 8.4–10.4)
CHLORIDE: 109 meq/L (ref 98–109)
CO2: 22 mEq/L (ref 22–29)
Creatinine: 0.8 mg/dL (ref 0.6–1.1)
Glucose: 117 mg/dl (ref 70–140)
POTASSIUM: 3.4 meq/L — AB (ref 3.5–5.1)
SODIUM: 142 meq/L (ref 136–145)
Total Bilirubin: 0.63 mg/dL (ref 0.20–1.20)
Total Protein: 7.6 g/dL (ref 6.4–8.3)

## 2016-08-16 LAB — CBC WITH DIFFERENTIAL/PLATELET
BASO%: 0.5 % (ref 0.0–2.0)
BASOS ABS: 0 10*3/uL (ref 0.0–0.1)
EOS ABS: 0.1 10*3/uL (ref 0.0–0.5)
EOS%: 2 % (ref 0.0–7.0)
HEMATOCRIT: 37.1 % (ref 34.8–46.6)
HEMOGLOBIN: 12.4 g/dL (ref 11.6–15.9)
LYMPH%: 36.2 % (ref 14.0–49.7)
MCH: 29.8 pg (ref 25.1–34.0)
MCHC: 33.3 g/dL (ref 31.5–36.0)
MCV: 89.3 fL (ref 79.5–101.0)
MONO#: 0.5 10*3/uL (ref 0.1–0.9)
MONO%: 7.4 % (ref 0.0–14.0)
NEUT#: 3.9 10*3/uL (ref 1.5–6.5)
NEUT%: 53.9 % (ref 38.4–76.8)
Platelets: 123 10*3/uL — ABNORMAL LOW (ref 145–400)
RBC: 4.16 10*6/uL (ref 3.70–5.45)
RDW: 13.6 % (ref 11.2–14.5)
WBC: 7.3 10*3/uL (ref 3.9–10.3)
lymph#: 2.6 10*3/uL (ref 0.9–3.3)

## 2016-08-16 NOTE — Telephone Encounter (Signed)
Left message for pt to return call to discuss appt today.

## 2016-08-18 ENCOUNTER — Encounter: Payer: Self-pay | Admitting: Hematology

## 2016-08-20 ENCOUNTER — Telehealth: Payer: Self-pay | Admitting: Hematology

## 2016-08-20 NOTE — Telephone Encounter (Signed)
No LOS per 08/16/16 visit. °

## 2016-08-21 ENCOUNTER — Ambulatory Visit (HOSPITAL_COMMUNITY): Payer: Federal, State, Local not specified - PPO

## 2016-08-21 ENCOUNTER — Other Ambulatory Visit (HOSPITAL_COMMUNITY): Payer: Federal, State, Local not specified - PPO

## 2016-08-21 ENCOUNTER — Encounter (HOSPITAL_COMMUNITY): Payer: Self-pay

## 2016-09-03 ENCOUNTER — Ambulatory Visit (HOSPITAL_COMMUNITY): Payer: Federal, State, Local not specified - PPO

## 2016-09-03 ENCOUNTER — Other Ambulatory Visit (HOSPITAL_COMMUNITY): Payer: Federal, State, Local not specified - PPO

## 2016-09-04 ENCOUNTER — Other Ambulatory Visit (HOSPITAL_COMMUNITY): Payer: Federal, State, Local not specified - PPO

## 2016-09-20 ENCOUNTER — Encounter: Payer: Self-pay | Admitting: Interventional Radiology

## 2016-09-23 ENCOUNTER — Other Ambulatory Visit: Payer: Self-pay | Admitting: Hematology

## 2016-10-16 ENCOUNTER — Ambulatory Visit: Payer: Federal, State, Local not specified - PPO | Admitting: Hematology

## 2016-10-16 ENCOUNTER — Other Ambulatory Visit: Payer: Federal, State, Local not specified - PPO

## 2016-10-17 NOTE — Addendum Note (Signed)
Addendum  created 10/17/16 1540 by Josephine Igo, MD   Sign clinical note

## 2016-10-17 NOTE — Anesthesia Postprocedure Evaluation (Signed)
Anesthesia Post Note  Patient: Kristen Everett  Procedure(s) Performed: Procedure(s) (LRB): DIAGNOSTIC LAPAROSCOPY (N/A) EXPLORATORY LAPAROTOMY (N/A) REPAIR OF PERFORATED DUODENAL ULCER (N/A)     Anesthesia Post Evaluation  Last Vitals:  Vitals:   05/12/16 0439 05/12/16 1020  BP: (!) 150/69 (!) 142/73  Pulse: 60 (!) 57  Resp: 19 18  Temp: 36.8 C 37 C    Last Pain:  Vitals:   05/12/16 1020  TempSrc: Oral  PainSc:                  [name] A.

## 2016-11-06 DEATH — deceased

## 2017-05-22 IMAGING — CT CT CHEST W/ CM
2 of 4 series · 14 of 36 positions shown, 17 images · IV contrast (iopamidol)
Comparison: 06/11/2016 MRI abdomen and 05/06/2016 CT
abdomen/pelvis.

CLINICAL DATA: Findings suspicious for multifocal hepatocellular
carcinoma in the liver on recent MRI abdomen study. Patient presents
for chest staging.

EXAM:
CT CHEST WITH CONTRAST
TECHNIQUE: Multidetector CT imaging of the chest was performed during
intravenous contrast administration.
CONTRAST:  75mL [id number] IOPAMIDOL ([id number]) INJECTION 61%

[Series 2: axial st · axial · 0.77mm/px · z∈[+284,+542]mm · 11 of 153 slices shown, 14 images]
[im 12/153  mediastinal]
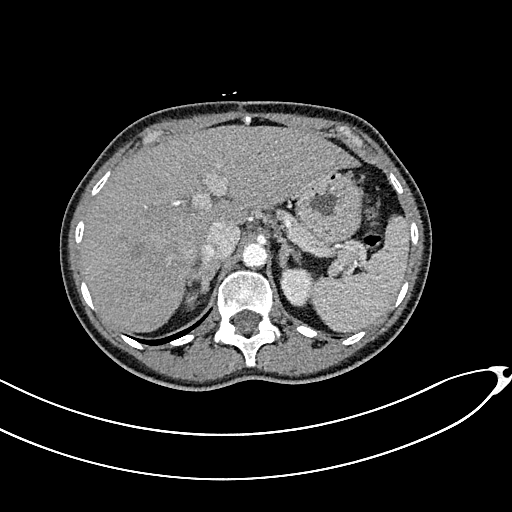
[im 12/153  lung]
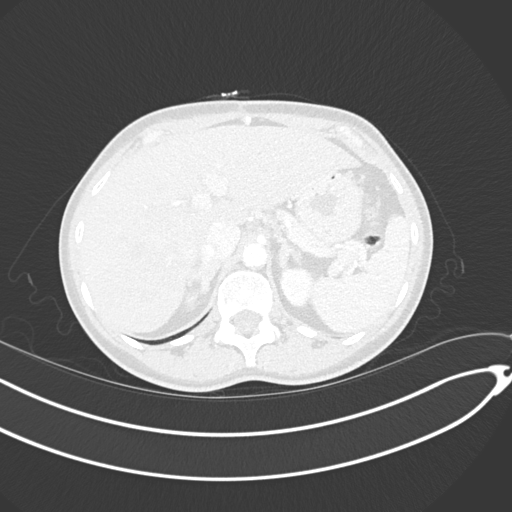
[im 24/153  lung]
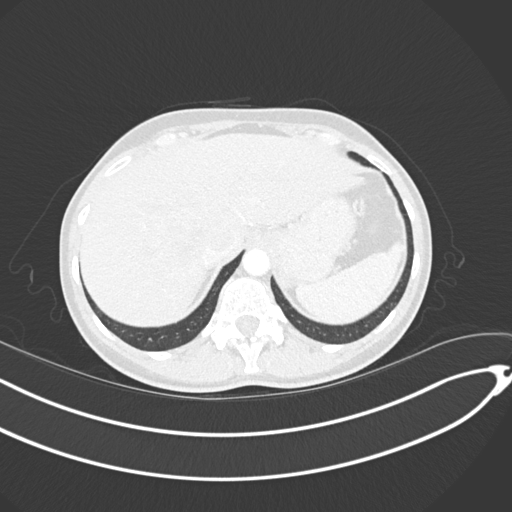
[im 36/153  lung]
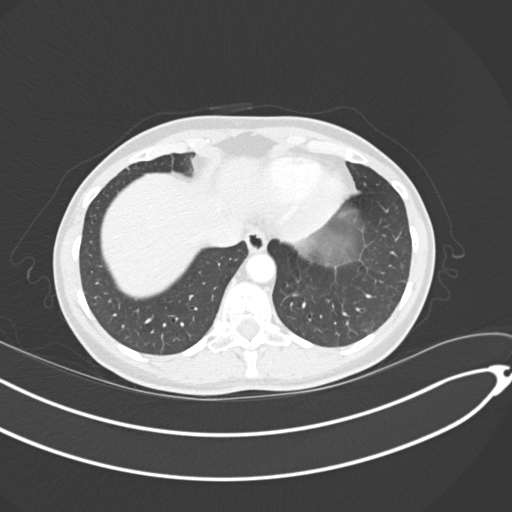
[im 47/153  lung]
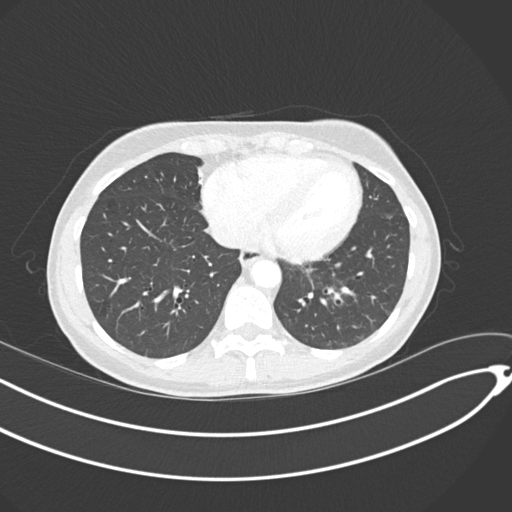
[im 59/153  mediastinal]
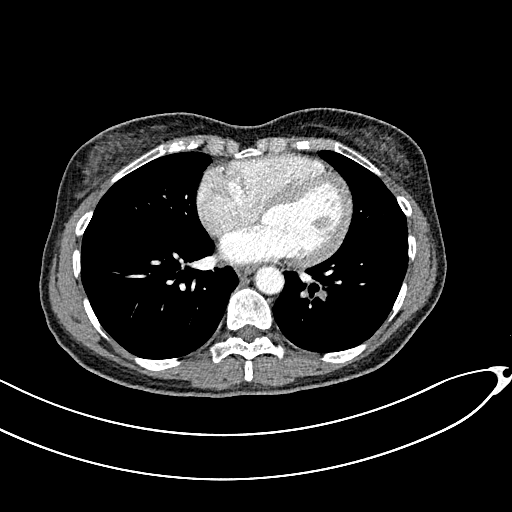
[im 59/153  lung]
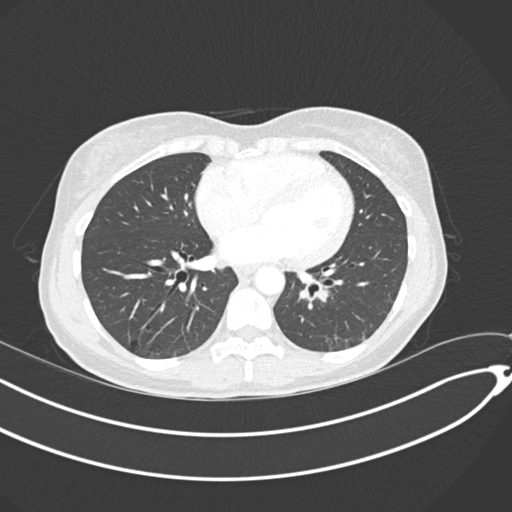
[im 82/153  lung]
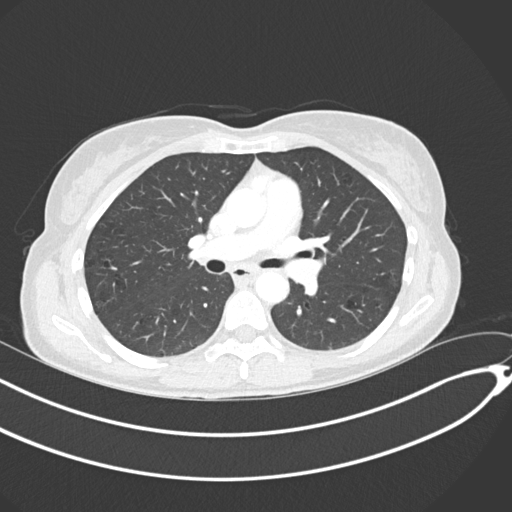
[im 94/153  lung]
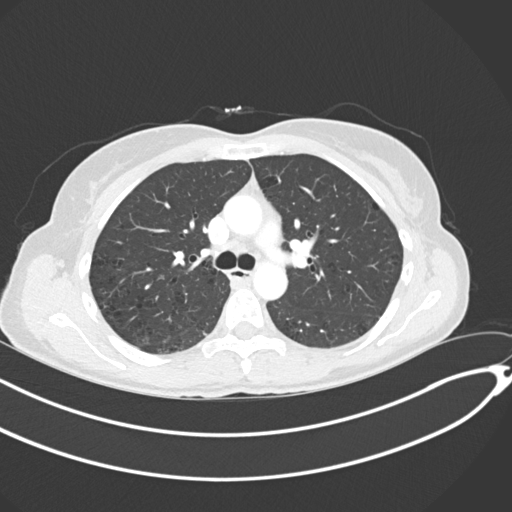
[im 106/153  lung]
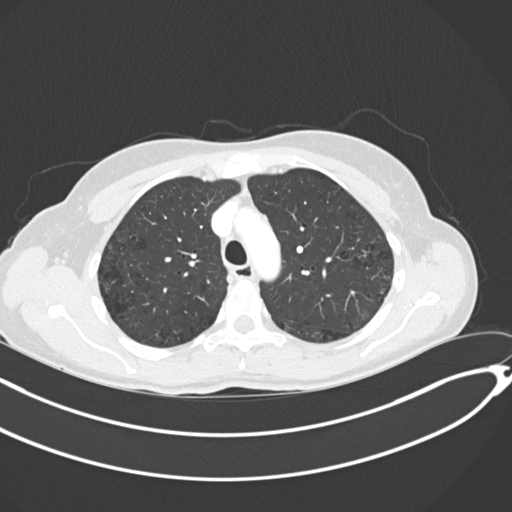
[im 117/153  mediastinal]
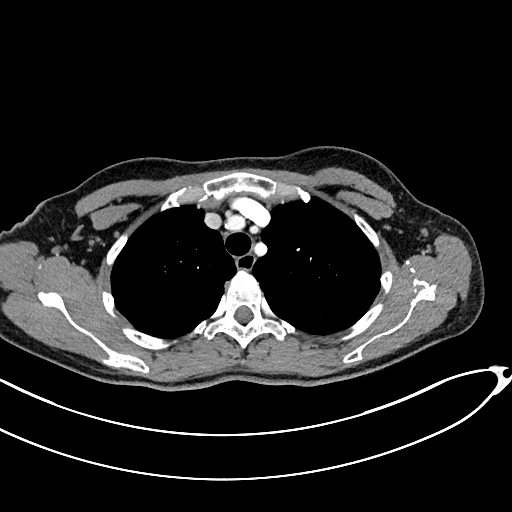
[im 117/153  lung]
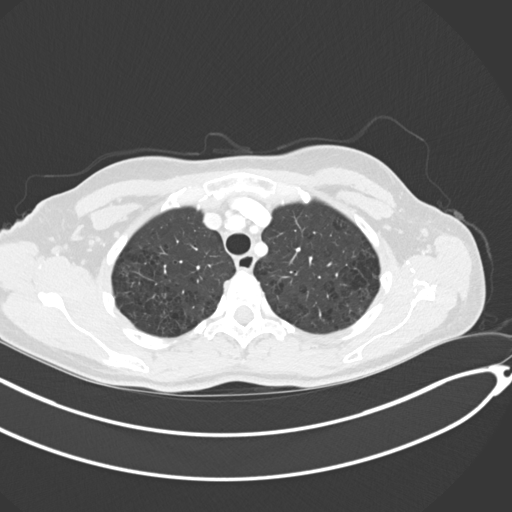
[im 129/153  lung]
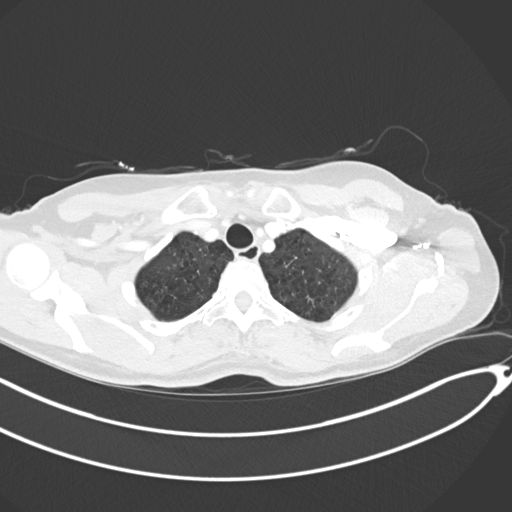
[im 141/153  lung]
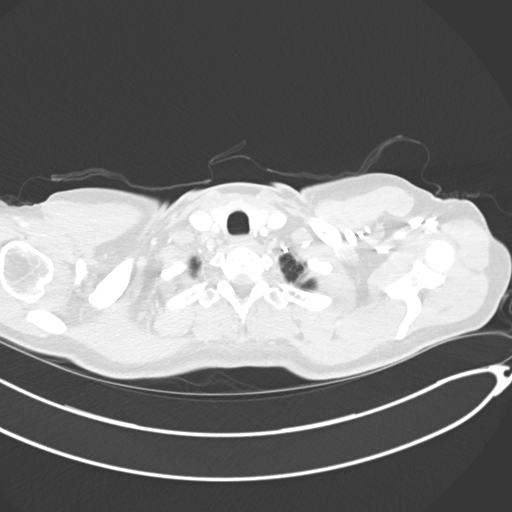

[Series 6: coronal · coronal · 0.61mm/px · 3 of 117 slices shown]
[im 24/117  lung]
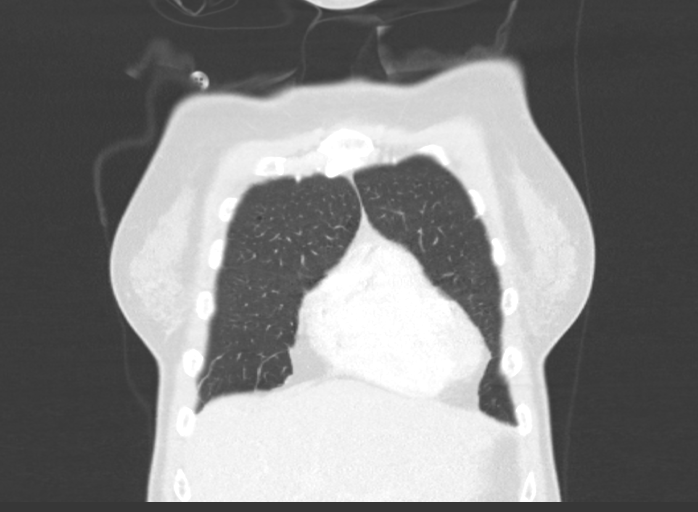
[im 47/117  lung]
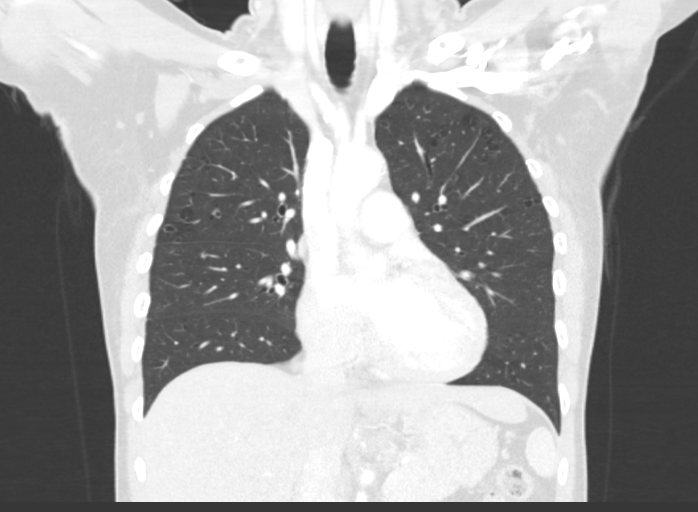
[im 70/117  lung]
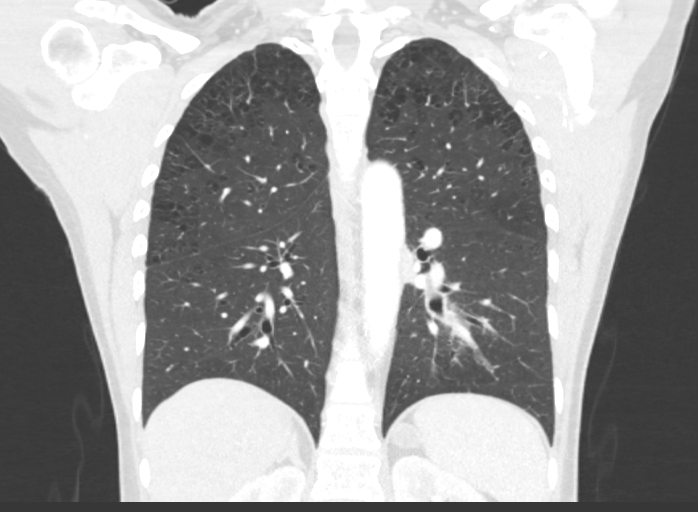

[14 of 36 positions shown; findings below may reference images not displayed]

FINDINGS: Cardiovascular: Normal heart size. No significant pericardial
fluid/thickening. Atherosclerotic nonaneurysmal abdominal aorta.
Normal caliber pulmonary arteries. Acute pulmonary emboli are
present within segmental and subsegmental left lower lobe pulmonary
artery branches (series 2/ images 91 - 101).

Mediastinum/Nodes: No discrete thyroid nodules. Fluid level in the
mid to lower thoracic esophagus suggesting esophageal dysmotility
and/ or gastroesophageal reflux. No pathologically enlarged
axillary, mediastinal or hilar lymph nodes.

Lungs/Pleura: No pneumothorax. No pleural effusion. Mild-to-moderate
centrilobular emphysema. No acute consolidative airspace disease or
lung masses. There a few scattered tiny pulmonary nodules in both
lungs measuring up to the 4 mm in the left upper lobe (series 5/
image 29).

Upper abdomen: Re- demonstration of known infiltrative mass in the
central right liver lobe with increasing IVC invasion (series 2/
image 131) compared to the 06/12/2015 MRI. Right adrenal 1.1 cm
nodule is stable since 05/06/2016 and demonstrates signal loss on
out of phase chemical shift imaging on the 06/11/2016 MRI,
compatible with an adenoma. Small gastric diverticulum is noted
between the gastric fundus and left adrenal gland.

Musculoskeletal: No aggressive appearing focal osseous lesions. Mild
thoracic spondylosis.
IMPRESSION: 1. Acute small burden pulmonary embolism in the left lower lobe
segmental and subsegmental branches. These may represent tumor
thrombi.
2. Re- demonstration of known infiltrative liver mass in the central
right liver lobe with increasing IVC invasion compared to the
06/12/2015 MRI.
3. A few scattered tiny pulmonary nodules, largest 4 mm,
indeterminate. Recommend attention on follow-up chest CT in 3-6
months.
4. Aortic atherosclerosis.
5. Mild-to-moderate centrilobular emphysema.
6. Right adrenal adenoma.
Critical Value/emergent results were called by telephone at the time
of interpretation on 07/10/2016 at [DATE] to Dr. LONGJUN CABO , who
verbally acknowledged these results.

## 2018-09-24 IMAGING — US US BIOPSY
1 series · 5 of 5 positions shown · non-contrast
Comparison: none

INDICATION: Central right lobe liver mass

[Series 1: us biopsy · 0.23mm/px · 5 of 5 slices shown]
[im 1/5]
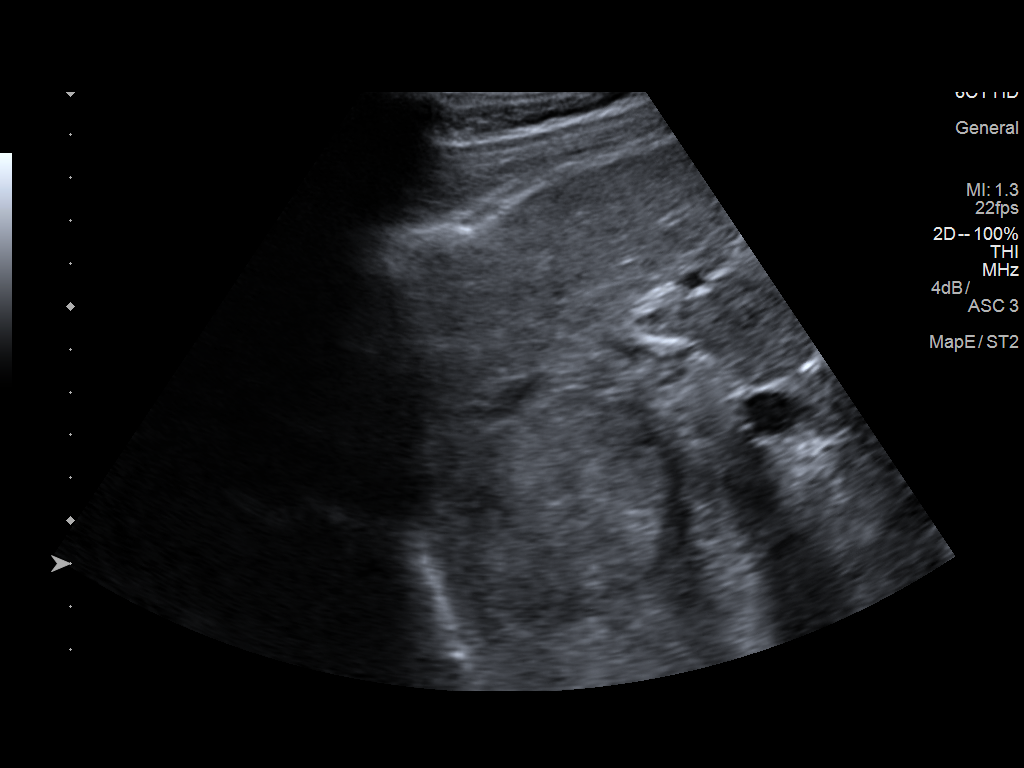
[im 2/5]
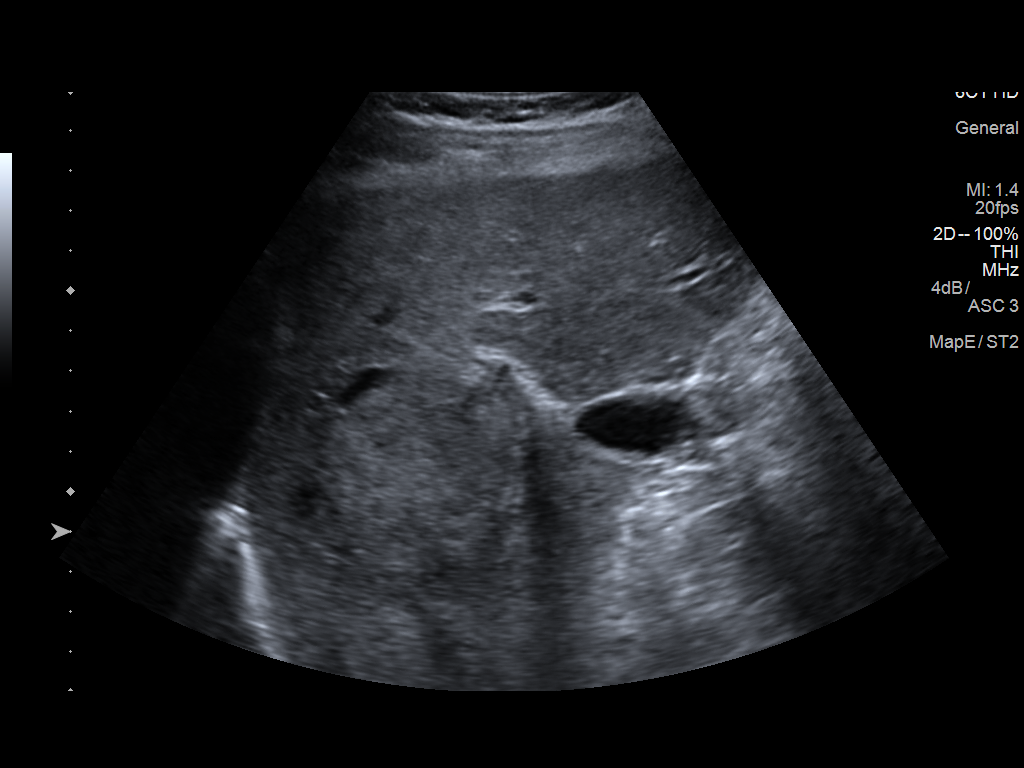
[im 3/5]
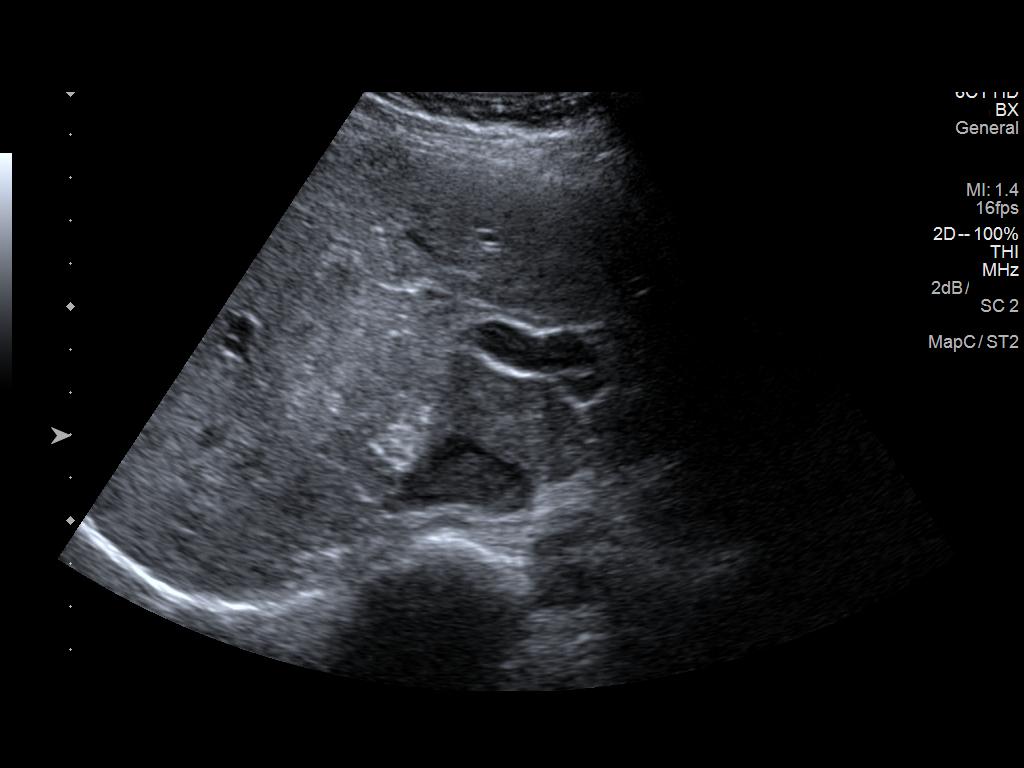
[im 4/5]
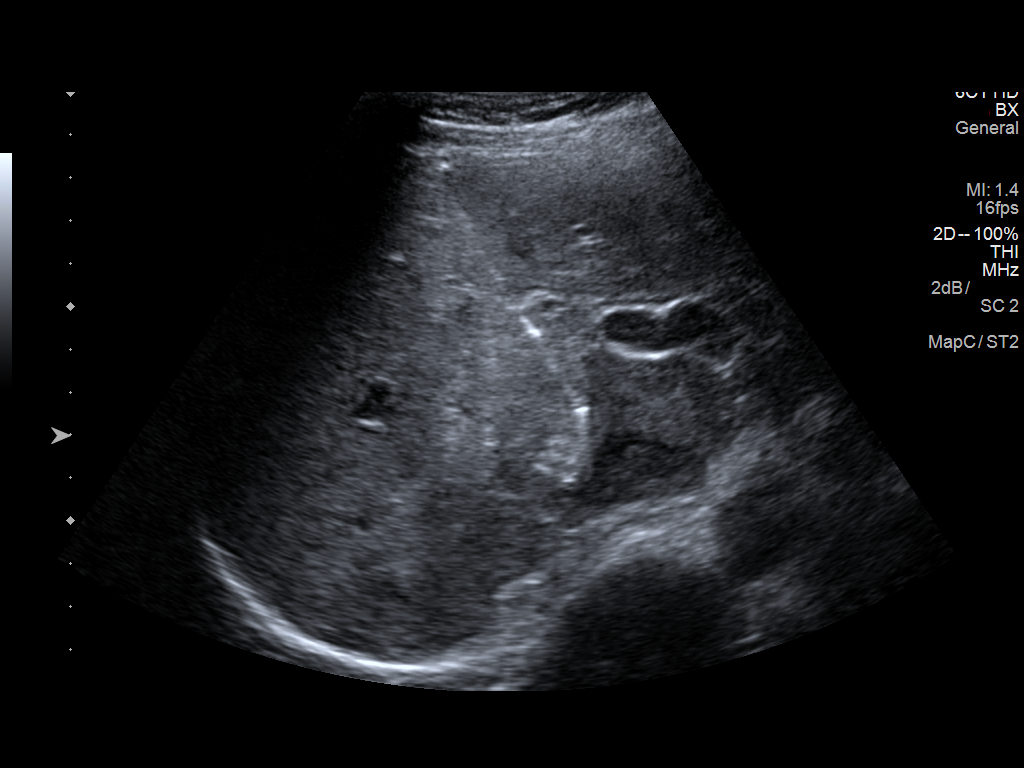
[im 5/5]
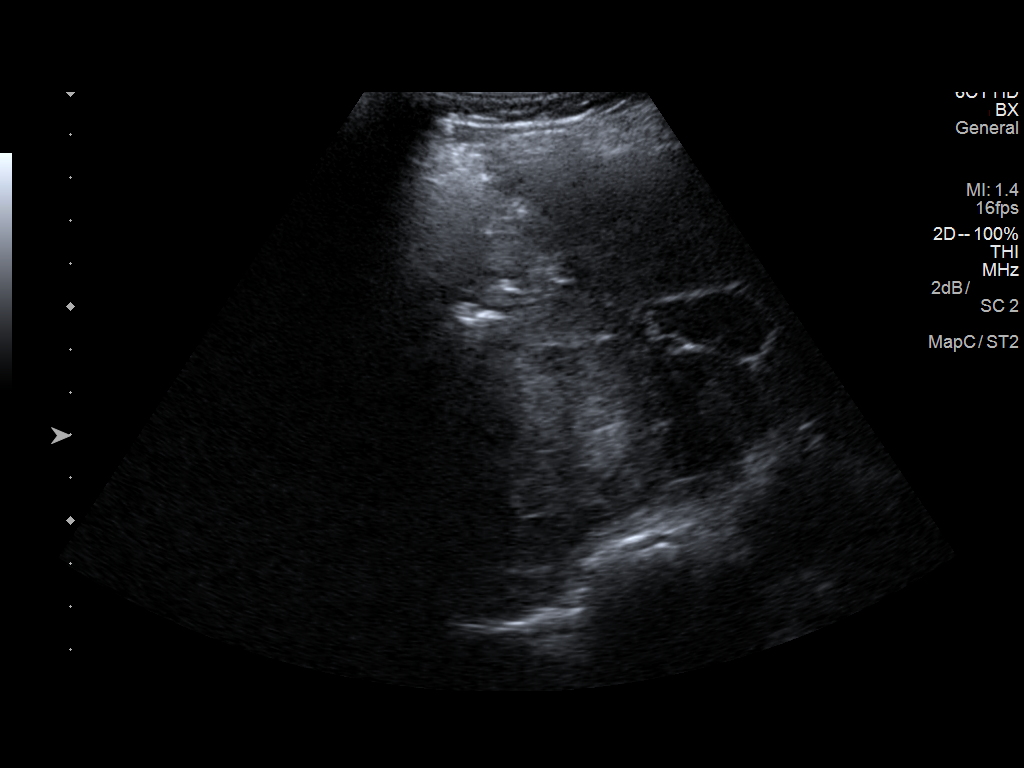

[5 of 5 positions shown; findings below may reference images not displayed]

EXAM:
ULTRASOUND-GUIDED BIOPSY OF A CENTRAL RIGHT LOBE LIVER MASS.  CORE.

MEDICATIONS:
None.

ANESTHESIA/SEDATION:
Fentanyl 100 mcg IV; Versed 2 mg IV

Moderate Sedation Time:  10

The patient was continuously monitored during the procedure by the
interventional radiology nurse under my direct supervision.

FLUOROSCOPY TIME:  None.

COMPLICATIONS:
None immediate.

PROCEDURE:
Informed written consent was obtained from the patient after a
thorough discussion of the procedural risks, benefits and
alternatives. All questions were addressed. Maximal Sterile Barrier
Technique was utilized including caps, mask, sterile gowns, sterile
gloves, sterile drape, hand hygiene and skin antiseptic. A timeout
was performed prior to the initiation of the procedure.

The right flank was prepped with ChloraPrep in a sterile fashion,
and a sterile drape was applied covering the operative field. A
sterile gown and sterile gloves were used for the procedure.

Under sonographic guidance, an 17 gauge guide needle was advanced
into the central right lobe liver lesion. Subsequently 4 18 gauge
core biopsies were obtained. Gel-Foam slurry was injected into the
needle tract. The guide needle was removed. Final imaging was
performed.

Patient tolerated the procedure well without complication. Vital
sign monitoring by nursing staff during the procedure will continue
as patient is in the special procedures unit for post procedure
observation.
FINDINGS: The images document guide needle placement within the central right
lobe liver lesion. Post biopsy images demonstrate no hemorrhage.
IMPRESSION: Successful ultrasound-guided core biopsy of a central right lobe
liver lesion.
